# Patient Record
Sex: Female | Born: 1990 | Race: White | Hispanic: No | Marital: Married | Smoking: Never smoker
Health system: Southern US, Community
[De-identification: ages and names within clinical notes are randomized; demographics above are authoritative.]

## PROBLEM LIST (undated history)

## (undated) DIAGNOSIS — E162 Hypoglycemia, unspecified: Secondary | ICD-10-CM

## (undated) DIAGNOSIS — F444 Conversion disorder with motor symptom or deficit: Secondary | ICD-10-CM

## (undated) DIAGNOSIS — T5691XA Toxic effect of unspecified metal, accidental (unintentional), initial encounter: Secondary | ICD-10-CM

## (undated) DIAGNOSIS — F424 Excoriation (skin-picking) disorder: Secondary | ICD-10-CM

## (undated) DIAGNOSIS — F3181 Bipolar II disorder: Secondary | ICD-10-CM

## (undated) DIAGNOSIS — J45909 Unspecified asthma, uncomplicated: Secondary | ICD-10-CM

## (undated) DIAGNOSIS — F449 Dissociative and conversion disorder, unspecified: Secondary | ICD-10-CM

## (undated) DIAGNOSIS — F4310 Post-traumatic stress disorder, unspecified: Secondary | ICD-10-CM

## (undated) DIAGNOSIS — F329 Major depressive disorder, single episode, unspecified: Secondary | ICD-10-CM

## (undated) DIAGNOSIS — T8859XA Other complications of anesthesia, initial encounter: Secondary | ICD-10-CM

## (undated) DIAGNOSIS — M249 Joint derangement, unspecified: Secondary | ICD-10-CM

## (undated) HISTORY — DX: Toxic effect of unspecified metal, accidental (unintentional), initial encounter: T56.91XA

## (undated) HISTORY — DX: Conversion disorder with motor symptom or deficit: F44.4

## (undated) HISTORY — PX: MOUTH SURGERY: SHX715

## (undated) HISTORY — DX: Post-traumatic stress disorder, unspecified: F43.10

## (undated) HISTORY — DX: Excoriation (skin-picking) disorder: F42.4

## (undated) HISTORY — PX: WISDOM TOOTH EXTRACTION: SHX21

## (undated) HISTORY — DX: Joint derangement, unspecified: M24.9

## (undated) HISTORY — PX: MULTIPLE TOOTH EXTRACTIONS: SHX2053

## (undated) HISTORY — DX: Hypoglycemia, unspecified: E16.2

## (undated) HISTORY — DX: Unspecified asthma, uncomplicated: J45.909

## (undated) HISTORY — DX: Other complications of anesthesia, initial encounter: T88.59XA

---
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
## 2015-10-12 DIAGNOSIS — F449 Dissociative and conversion disorder, unspecified: Secondary | ICD-10-CM

## 2015-10-12 HISTORY — DX: Dissociative and conversion disorder, unspecified: F44.9

## 2019-01-03 ENCOUNTER — Ambulatory Visit (INDEPENDENT_AMBULATORY_CARE_PROVIDER_SITE_OTHER): Payer: Managed Care, Other (non HMO) | Admitting: Family Medicine

## 2019-01-03 ENCOUNTER — Other Ambulatory Visit: Payer: Self-pay

## 2019-01-03 ENCOUNTER — Encounter: Payer: Self-pay | Admitting: Family Medicine

## 2019-01-03 VITALS — BP 116/64 | HR 111 | Temp 98.6°F | Ht 68.0 in | Wt 131.4 lb

## 2019-01-03 DIAGNOSIS — J4520 Mild intermittent asthma, uncomplicated: Secondary | ICD-10-CM

## 2019-01-03 DIAGNOSIS — F325 Major depressive disorder, single episode, in full remission: Secondary | ICD-10-CM | POA: Diagnosis not present

## 2019-01-03 DIAGNOSIS — F4310 Post-traumatic stress disorder, unspecified: Secondary | ICD-10-CM

## 2019-01-03 DIAGNOSIS — F319 Bipolar disorder, unspecified: Secondary | ICD-10-CM | POA: Insufficient documentation

## 2019-01-03 DIAGNOSIS — M25552 Pain in left hip: Secondary | ICD-10-CM | POA: Insufficient documentation

## 2019-01-03 HISTORY — DX: Pain in left hip: M25.552

## 2019-01-03 HISTORY — DX: Mild intermittent asthma, uncomplicated: J45.20

## 2019-01-03 NOTE — Assessment & Plan Note (Signed)
Stable.  Place referral to psychiatry. 
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
## 2019-01-03 NOTE — Assessment & Plan Note (Signed)
Stable.  Place referral to psychiatry.

## 2019-01-03 NOTE — Assessment & Plan Note (Signed)
History possibly consistent with labral tear given her mechanical symptoms however she has no clear traumatic history.  She does have some hip flexor and abductor weakness on exam which could be contributing.  Discussed home exercise program focused on strengthening these muscles.  Will place referral to sports medicine for further evaluation and management.

## 2019-01-03 NOTE — Patient Instructions (Signed)
It was very nice to see you today!  I think you are having an issue with your hip.  It is possible that she could have a labral tear.  Please work on exercises.  Please come back to see Dr. Berline Chough soon.  Please establish with a psychiatrist soon.   Please come back soon for your physical with blood work.  Take care, Dr Jimmey Ralph

## 2019-01-03 NOTE — Assessment & Plan Note (Signed)
Stable

## 2019-01-15 ENCOUNTER — Ambulatory Visit: Payer: Managed Care, Other (non HMO) | Admitting: Sports Medicine

## 2019-02-09 ENCOUNTER — Other Ambulatory Visit: Payer: Self-pay

## 2019-02-09 DIAGNOSIS — M25552 Pain in left hip: Secondary | ICD-10-CM

## 2019-02-21 ENCOUNTER — Ambulatory Visit (INDEPENDENT_AMBULATORY_CARE_PROVIDER_SITE_OTHER): Payer: Managed Care, Other (non HMO) | Admitting: Family Medicine

## 2019-02-21 ENCOUNTER — Ambulatory Visit: Payer: Self-pay

## 2019-02-21 ENCOUNTER — Other Ambulatory Visit: Payer: Self-pay | Admitting: Family Medicine

## 2019-02-21 ENCOUNTER — Other Ambulatory Visit: Payer: Self-pay

## 2019-02-21 ENCOUNTER — Ambulatory Visit (INDEPENDENT_AMBULATORY_CARE_PROVIDER_SITE_OTHER)
Admission: RE | Admit: 2019-02-21 | Discharge: 2019-02-21 | Disposition: A | Discharge status: Home / Self Care | Payer: Managed Care, Other (non HMO) | Source: Ambulatory Visit | Attending: Family Medicine | Admitting: Family Medicine

## 2019-02-21 ENCOUNTER — Encounter: Payer: Self-pay | Admitting: Family Medicine

## 2019-02-21 VITALS — BP 100/74 | HR 86 | Ht 68.0 in | Wt 134.0 lb

## 2019-02-21 DIAGNOSIS — M25552 Pain in left hip: Secondary | ICD-10-CM

## 2019-02-21 DIAGNOSIS — M24552 Contracture, left hip: Secondary | ICD-10-CM | POA: Diagnosis not present

## 2019-02-21 DIAGNOSIS — G8929 Other chronic pain: Secondary | ICD-10-CM

## 2019-02-21 DIAGNOSIS — E559 Vitamin D deficiency, unspecified: Secondary | ICD-10-CM | POA: Insufficient documentation

## 2019-02-21 DIAGNOSIS — M999 Biomechanical lesion, unspecified: Secondary | ICD-10-CM

## 2019-02-21 DIAGNOSIS — M357 Hypermobility syndrome: Secondary | ICD-10-CM | POA: Diagnosis not present

## 2019-02-21 HISTORY — DX: Vitamin D deficiency, unspecified: E55.9

## 2019-02-21 MED ORDER — DICLOFENAC SODIUM 2 % TD SOLN: 2.0000 g | Freq: Two times a day (BID) | TRANSDERMAL | 3 refills | Status: DC

## 2019-02-21 MED ORDER — VITAMIN D (ERGOCALCIFEROL) 1.25 MG (50000 UNIT) PO CAPS: 50000.0000 [IU] | ORAL_CAPSULE | ORAL | 0 refills | Status: DC

## 2019-02-21 NOTE — Assessment & Plan Note (Signed)
beighton score of 7 while she does text me actively
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
## 2019-02-21 NOTE — Patient Instructions (Signed)
Good to see you  Ice 20 minutes 2 times daily. Usually after activity and before bed. Exercises 3 times a week.  pennsaid pinkie amount topically 2 times daily as needed.  Once weekly vitamin D for 12 weeks for the bone health.  We need to work more on stability and less on flexibility  Started manipulation today as well  See me again in 3-4 weeks
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
## 2019-03-05 DIAGNOSIS — M999 Biomechanical lesion, unspecified: Secondary | ICD-10-CM | POA: Insufficient documentation

## 2019-03-05 HISTORY — DX: Biomechanical lesion, unspecified: M99.9

## 2019-03-05 NOTE — Assessment & Plan Note (Signed)
Decision today to treat with OMT was based on Physical Exam  After verbal consent patient was treated with HVLA, ME, FPR techniques in cervical, thoracic, rib lumbar and sacral areas  Patient tolerated the procedure well with improvement in symptoms  Patient given exercises, stretches and lifestyle modifications  See medications in patient instructions if given  Patient will follow up in 6 weeks 

## 2019-04-10 ENCOUNTER — Encounter: Payer: Self-pay | Admitting: Nurse Practitioner

## 2019-04-10 ENCOUNTER — Other Ambulatory Visit: Payer: Self-pay

## 2019-04-10 ENCOUNTER — Ambulatory Visit (INDEPENDENT_AMBULATORY_CARE_PROVIDER_SITE_OTHER): Payer: Managed Care, Other (non HMO) | Admitting: Nurse Practitioner

## 2019-04-10 VITALS — BP 116/74 | HR 86 | Ht 68.0 in | Wt 135.3 lb

## 2019-04-10 DIAGNOSIS — N898 Other specified noninflammatory disorders of vagina: Secondary | ICD-10-CM

## 2019-04-10 DIAGNOSIS — Z1151 Encounter for screening for human papillomavirus (HPV): Secondary | ICD-10-CM | POA: Diagnosis not present

## 2019-04-10 DIAGNOSIS — Z124 Encounter for screening for malignant neoplasm of cervix: Secondary | ICD-10-CM | POA: Diagnosis not present

## 2019-04-10 DIAGNOSIS — Z113 Encounter for screening for infections with a predominantly sexual mode of transmission: Secondary | ICD-10-CM | POA: Diagnosis not present

## 2019-04-10 DIAGNOSIS — Z0000 Encounter for general adult medical examination without abnormal findings: Secondary | ICD-10-CM

## 2019-04-10 NOTE — Progress Notes (Signed)
New Patient is in the office for annual. Pt does not want any birth control. Pt reports vaginal discharge, previous abdominal pain. GAD7 score= 5.

## 2019-04-10 NOTE — Progress Notes (Signed)
GYNECOLOGY ANNUAL PREVENTATIVE CARE ENCOUNTER NOTE  Subjective:   Maureen [name] is a 28 y.o. [id number] female here for a routine annual gynecologic exam.  Current complaints: vaginal discharge and abdominal pain.   Has monthly menses lasting 4-5 days.  No cramping during menses.  Had brown discharge - like molasses - about a month ago with itching.  Still has a thicker discharge than usual but is no longer brown.  Is a bit worried that she has an infection.  Denies abnormal vaginal bleeding, problems with intercourse or other gynecologic concerns. Does not use contraception.  Is wanting to become pregnant at some point but is not actively trying to conceive at this time.  Intercourse twice a month now.  Of note, she had one second trimester miscarriage.  Baby's heart beat stopped 2 weeks prior to the miscarriage.  Also has a family history of women who had some difficulty becoming pregnant so she does have concerns that she may have trouble conceiving.  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   Gynecologic History Patient's last menstrual period was 03/30/2019. Contraception: none Last Pap: 4 years ago and client reports results were normal.  Obstetric History OB History  Gravida Para Term Preterm AB Living  1       1    SAB TAB Ectopic Multiple Live Births               # Outcome Date GA Lbr Len/2nd Weight Sex Delivery Anes PTL Lv  1 AB 2015 [id number]    SAB       Past Medical History:  Diagnosis Date  . Asthma   . Bipolar 2 disorder (HCC) 2007  . Conversion disorder 2017  . Depression 2007  . Heavy metal poisoning    s/p chelation    History reviewed. No pertinent surgical history.  Current Outpatient Medications on File Prior to Visit  Medication Sig Dispense Refill  . Vitamin D, Ergocalciferol, (DRISDOL) 1.25 MG (50000 UT) CAPS capsule Take 1 capsule (50,000 Units total) by mouth every 7 (seven) days. 12 capsule 0   No current  facility-administered medications on file prior to visit.     Allergies  Allergen Reactions  . Cyclobenzaprine Other (See Comments)    Psychosis  . Penicillins Hives    Social History   Socioeconomic History  . Marital status: Married    Spouse name: Not on file  . Number of children: Not on file  . Years of education: Not on file  . Highest education level: Not on file  Occupational History  . Not on file  Social Needs  . Financial resource strain: Not on file  . Food insecurity    Worry: Not on file    Inability: Not on file  . Transportation needs    Medical: Not on file    Non-medical: Not on file  Tobacco Use  . Smoking status: Never Smoker  . Smokeless tobacco: Never Used  Substance and Sexual Activity  . Alcohol use: Yes    Comment: Rare  . Drug use: Never  . Sexual activity: Yes    Birth control/protection: None  Lifestyle  . Physical activity    Days per week: Not on file    Minutes per session: Not on file  . Stress: Not on file  Relationships  . Social Musicianconnections    Talks on phone: Not on file    Gets together: Not on  file    Attends religious service: Not on file    Active member of club or organization: Not on file    Attends meetings of clubs or organizations: Not on file    Relationship status: Not on file  . Intimate partner violence    Fear of current or ex partner: Not on file    Emotionally abused: Not on file    Physically abused: Not on file    Forced sexual activity: Not on file  Other Topics Concern  . Not on file  Social History Narrative  . Not on file    History reviewed. No pertinent family history.  The following portions of the patient's history were reviewed and updated as appropriate: allergies, current medications, past family history, past medical history, past social history, past surgical history and problem list.  Review of Systems Pertinent items noted in HPI and remainder of comprehensive ROS otherwise negative.    Objective:  BP 116/74   Pulse 86   Ht 5\' 8"  (1.727 m)   Wt 135 lb 4.8 oz (61.4 kg)   LMP 03/30/2019   BMI 20.57 kg/m  CONSTITUTIONAL: Well-developed, well-nourished female in no acute distress.  HENT:  Normocephalic, atraumatic, External right and left ear normal.  EYES: Conjunctivae and EOM are normal. Pupils are equal, round.  No scleral icterus.  NECK: Normal range of motion, supple, no masses.  Normal thyroid.  SKIN: Skin is warm and dry. No rash noted. Not diaphoretic. No erythema. No pallor. NEUROLOGIC: Alert and oriented to person, place, and time. Normal reflexes, muscle tone coordination. No cranial nerve deficit noted. PSYCHIATRIC: Normal mood and affect. Normal behavior. Normal judgment and thought content. CARDIOVASCULAR: Normal heart rate noted, regular rhythm RESPIRATORY: Clear to auscultation bilaterally. Effort and breath sounds normal, no problems with respiration noted. BREASTS: Symmetric in size. No masses, skin changes, nipple drainage, or lymphadenopathy. ABDOMEN: Soft, no distention noted.  No tenderness, rebound or guarding.  PELVIC: Normal appearing external genitalia; normal appearing vaginal mucosa and cervix. Discharge was creamy white. No blood.  Pap smear obtained.  Normal uterine size, no other palpable masses, no uterine or adnexal tenderness. MUSCULOSKELETAL: Normal range of motion. No tenderness.  No cyanosis, clubbing, or edema.    Assessment and Plan:  1. Encounter for annual physical examination excluding gynecological examination in a patient older than 17 years Client reports some anemia and does want to conceive at some point in the next few years.  Will check CBC for HGB.   Advised multivitamin with folic acid daily as she may be wanting to be pregnant. Reviewed most fertile period in menstrual cycle and timing of intercourse to become pregnant.  - Cytology - PAP( [facility]) - Cervicovaginal ancillary only( [facility]) - HIV Antibody  (routine testing w rflx) - RPR - CBC  2. Vaginal discharge White discharge noted today - could be normal but client reports previous brown blood and itching so will check for BV, Trich, yeast, GC and Chlam.  Client has MyChart so will plan to send message if any infection is found.  Will follow up results of pap smear and manage accordingly. Routine preventative health maintenance measures emphasized. Please refer to After Visit Summary for other counseling recommendations.    Earlie Server, RN, MSN, NP-BC Nurse Practitioner, [place] for [facility]

## 2019-04-11 LAB — CERVICOVAGINAL ANCILLARY ONLY
Bacterial vaginitis: NEGATIVE
Candida vaginitis: NEGATIVE
Chlamydia: NEGATIVE
Neisseria Gonorrhea: NEGATIVE
Trichomonas: NEGATIVE

## 2019-04-11 LAB — CYTOLOGY - PAP
Diagnosis: NEGATIVE
HPV: NOT DETECTED

## 2019-04-12 LAB — HIV ANTIBODY (ROUTINE TESTING W REFLEX): HIV Screen 4th Generation wRfx: NONREACTIVE

## 2019-04-12 LAB — CBC
Hematocrit: 37.4 % (ref 34.0–46.6)
Hemoglobin: 12.7 g/dL (ref 11.1–15.9)
MCH: 32.3 pg (ref 26.6–33.0)
MCHC: 34 g/dL (ref 31.5–35.7)
MCV: 95 fL (ref 79–97)
Platelets: 177 10*3/uL (ref 150–450)
RBC: 3.93 x10E6/uL (ref 3.77–5.28)
RDW: 12 % (ref 11.7–15.4)
WBC: 6.4 10*3/uL (ref 3.4–10.8)

## 2019-04-12 LAB — RPR: RPR Ser Ql: NONREACTIVE

## 2019-04-30 ENCOUNTER — Telehealth: Payer: Self-pay

## 2019-04-30 NOTE — Telephone Encounter (Signed)
Called patient back. She is doing fine and would like to not come out in [place] for her follow up. Told to call back if/when she would like to f/u. Patient voices understanding.

## 2019-07-18 ENCOUNTER — Ambulatory Visit: Payer: Managed Care, Other (non HMO) | Admitting: Family Medicine

## 2019-08-01 ENCOUNTER — Other Ambulatory Visit: Payer: Self-pay

## 2019-08-01 ENCOUNTER — Encounter: Payer: Self-pay | Admitting: Family Medicine

## 2019-08-01 ENCOUNTER — Ambulatory Visit (INDEPENDENT_AMBULATORY_CARE_PROVIDER_SITE_OTHER): Payer: Managed Care, Other (non HMO) | Admitting: Family Medicine

## 2019-08-01 VITALS — BP 106/73 | HR 79 | Temp 98.2°F | Ht 68.0 in | Wt 136.2 lb

## 2019-08-01 DIAGNOSIS — M357 Hypermobility syndrome: Secondary | ICD-10-CM

## 2019-08-01 DIAGNOSIS — M25511 Pain in right shoulder: Secondary | ICD-10-CM

## 2019-08-01 DIAGNOSIS — M8580 Other specified disorders of bone density and structure, unspecified site: Secondary | ICD-10-CM

## 2019-08-01 NOTE — Progress Notes (Signed)
   Chief Complaint:  Maureen [name] is a 28 y.o. female who presents today with a chief complaint of shoulder pain.   Assessment/Plan:  Right shoulder pain No significant abnormalities on exam however patient has a significant degree of hypermobility.  Is possible she could be having small subluxations causing her shoulder pain.  Discussed home exercises and handout was given.  She can use over-the-counter meds as needed for pain.  Will place referral to sports medicine for further evaluation management.  Osteopenia Unclear why patient has demineralization at such young age.  Will attempt to get DEXA scan.    Subjective:  HPI:  Right shoulder pain Started a few months ago.  She was doing some cleaning around the house and thinks she may have aggravated it then.  She said grinding and popping since then.  No medications tried.  Pain has been stable.  She was recently evaluated at sports medicine a few months ago for left hip pain.  She was noted to have hypermobility at time.  She was told that she was osteopenic at that appointment as well.  She is concerned about bone loss.  She has been taking vitamin D supplementation.  ROS: Per HPI  PMH: She reports that she has never smoked. She has never used smokeless tobacco. She reports current alcohol use. She reports that she does not use drugs.      Objective:  Physical Exam: BP 106/73   Pulse 79   Temp 98.2 F (36.8 C)   Ht 5\' 8"  (1.727 m)   Wt 136 lb 4 oz (61.8 kg)   LMP 07/25/2019 (Approximate)   SpO2 98%   BMI 20.72 kg/m   Gen: NAD, resting comfortably CV: Regular rate and rhythm with no murmurs appreciated Pulm: Normal work of breathing, clear to auscultation bilaterally with no crackles, wheezes, or rhonchi MSK: -Right shoulder: No deformities.  Full range of motion throughout.  Nontender to palpation.  No pain with resisted supraspinatus testing.  Internal and external rotation normal without pain.  Neer and Hawkins  test negative.  Neurovascular intact distally.      Algis Greenhouse. Jerline Pain, MD 08/01/2019 10:34 AM

## 2019-08-01 NOTE — Patient Instructions (Signed)
It was very nice to see you today!  Please work on the shoulder exercises.  We will try to get a bone density scan approved through your insurance.  I will place a referral for you to see the sports medicine center.  Take care, Dr Jerline Pain  Please try these tips to maintain a healthy lifestyle:   Eat at least 3 REAL meals and 1-2 snacks per day.  Aim for no more than 5 hours between eating.  If you eat breakfast, please do so within one hour of getting up.    Obtain twice as many fruits/vegetables as protein or carbohydrate foods for both lunch and dinner. (Half of each meal should be fruits/vegetables, one quarter protein, and one quarter starchy carbs)   Cut down on sweet beverages. This includes juice, soda, and sweet tea.    Exercise at least 150 minutes every week.

## 2019-11-13 ENCOUNTER — Telehealth: Payer: Self-pay

## 2019-11-13 NOTE — Telephone Encounter (Signed)
Patient calling to follow up on referral that handles bone density. Please call patient back.

## 2019-11-14 NOTE — Telephone Encounter (Signed)
Spoke with patient notified there has been a order placed in [place] her information to [facility] to schedule.

## 2019-11-21 ENCOUNTER — Other Ambulatory Visit: Payer: Managed Care, Other (non HMO)

## 2019-12-06 ENCOUNTER — Ambulatory Visit
Admission: RE | Admit: 2019-12-06 | Discharge: 2019-12-06 | Disposition: A | Discharge status: Home / Self Care | Payer: 59 | Source: Ambulatory Visit | Attending: Family Medicine | Admitting: Family Medicine

## 2019-12-06 ENCOUNTER — Other Ambulatory Visit: Payer: Self-pay

## 2019-12-06 DIAGNOSIS — M8580 Other specified disorders of bone density and structure, unspecified site: Secondary | ICD-10-CM

## 2019-12-06 NOTE — Progress Notes (Signed)
Please inform patient of the following:  Bone density scan is NORMAL.

## 2020-04-23 ENCOUNTER — Other Ambulatory Visit: Payer: Self-pay

## 2020-04-23 ENCOUNTER — Encounter: Payer: Self-pay | Admitting: Family Medicine

## 2020-04-23 ENCOUNTER — Ambulatory Visit: Payer: Self-pay | Admitting: Family Medicine

## 2020-04-23 VITALS — BP 110/70 | HR 89 | Temp 98.4°F | Ht 68.0 in | Wt 145.8 lb

## 2020-04-23 DIAGNOSIS — Z3490 Encounter for supervision of normal pregnancy, unspecified, unspecified trimester: Secondary | ICD-10-CM

## 2020-04-23 DIAGNOSIS — R21 Rash and other nonspecific skin eruption: Secondary | ICD-10-CM

## 2020-04-23 MED ORDER — PREDNISONE 10 MG PO TABS: ORAL_TABLET | ORAL | 0 refills | Status: DC

## 2020-04-23 NOTE — Progress Notes (Signed)
   Maureen [name] is a 29 y.o. female who presents today for an office visit.  Assessment/Plan:  New/Acute Problems: Rash Appearance consistent with eczema.  Possibly pregnancy-induced.  He does also possible this could represent contact dermatitis.  Though would typically not expect such widespread distribution for this.  Given widespread nature we will start prednisone burst.  20 mg daily for 1 week followed by 10 mg daily for 1 week.  Also recommended that she use Claritin or Zyrtec to help control pruritus.  Also recommended twice daily emollient with gentle lotion such as Aveeno or Eucerin sensitive skin.  Also discussed once or twice weekly bleach bath -instructions were given.  If no improvement in the next week or 2 will consider labs and possible biopsy.  Discussed reasons to return to care.    Subjective:  HPI:  Patient here with rash for the past 6 weeks or so.  Started on right arm and then spread to left leg and then her entire body. Husband works as Technical sales engineer. She is concerned about possible insecticide exposure. She has tried using hydrocortisone cream, camphor, benadryl, cocoa butter. Nothing has helped significantly.  She is about 3 months pregnant.  She has been taking the prenatal vitamins.  Has not noticed any other new obvious exposures.  No new soaps.  No detergents.  Symptoms were improving for a few weeks until significantly worsening a couple of weeks ago.  Rash is now located all over arms, legs, and torso.  She has never had any rash on her face.  No fevers or chills.  No recent illnesses.       Objective:  Physical Exam: BP 110/70   Pulse 89   Temp 98.4 F (36.9 C) (Temporal)   Ht 5\' 8"  (1.727 m)   Wt 145 lb 12.8 oz (66.1 kg)   LMP 07/25/2019 (Approximate)   SpO2 99%   BMI 22.17 kg/m   Gen: No acute distress, resting comfortably Skin: Flat erythematous rash with several excoriations scattered across lower extremities, upper extremities,  and torso.  Overlying scale noted as well.  No pustules.  No vesicles. Psych: Normal affect and thought content      Najee Cowens M. 07/27/2019, MD 04/23/2020 11:44 AM

## 2020-04-23 NOTE — Patient Instructions (Signed)
It was very nice to see you today!  Please start the prednisone. Take 2 tablets daily for 1 week and then 1 tab daily for the next week.  Please start taking Claritin or Zyrtec once daily.  Please make sure that you use a sensitive skin lotion such as Aveeno or Eucerin twice daily.  Please also take a bleach bath once or twice per week. Mix a half a cup of bleach with a tub full of water and soak for 10 minutes.  Let me know if not improving in the next week or so.   Take care, Dr Jimmey Ralph  Please try these tips to maintain a healthy lifestyle:   Eat at least 3 REAL meals and 1-2 snacks per day.  Aim for no more than 5 hours between eating.  If you eat breakfast, please do so within one hour of getting up.    Each meal should contain half fruits/vegetables, one quarter protein, and one quarter carbs (no bigger than a computer mouse)   Cut down on sweet beverages. This includes juice, soda, and sweet tea.     Drink at least 1 glass of water with each meal and aim for at least 8 glasses per day   Exercise at least 150 minutes every week.

## 2020-05-05 ENCOUNTER — Encounter: Payer: Self-pay | Admitting: Family Medicine

## 2020-05-05 ENCOUNTER — Encounter: Payer: Self-pay | Admitting: General Practice

## 2020-05-06 ENCOUNTER — Ambulatory Visit (INDEPENDENT_AMBULATORY_CARE_PROVIDER_SITE_OTHER): Payer: Self-pay | Admitting: *Deleted

## 2020-05-06 ENCOUNTER — Other Ambulatory Visit: Payer: Self-pay

## 2020-05-06 ENCOUNTER — Encounter: Payer: Self-pay | Admitting: General Practice

## 2020-05-06 VITALS — BP 118/77 | HR 94 | Temp 98.2°F | Wt 148.2 lb

## 2020-05-06 DIAGNOSIS — Z3481 Encounter for supervision of other normal pregnancy, first trimester: Secondary | ICD-10-CM

## 2020-05-06 DIAGNOSIS — Z3A14 14 weeks gestation of pregnancy: Secondary | ICD-10-CM

## 2020-05-06 DIAGNOSIS — Z3480 Encounter for supervision of other normal pregnancy, unspecified trimester: Secondary | ICD-10-CM | POA: Insufficient documentation

## 2020-05-06 LAB — CYSTIC FIBROSIS DIAGNOSTIC STUDY: Interpretation-CFDNA:: NEGATIVE

## 2020-05-06 NOTE — Progress Notes (Signed)
   PRENATAL INTAKE SUMMARY  Maureen [name] presents today New OB Nurse Interview.  OB History    Gravida  2   Para      Term      Preterm      AB  1   Living        SAB      TAB      Ectopic      Multiple      Live Births             I have reviewed the patient's medical, obstetrical, social, and family histories, medications, and available lab results.  SUBJECTIVE She has a body rash. Seen at primary care provider for the rash and taking steroids  OBJECTIVE Initial nurse interview for history and labs (New OB).  [facility]  EDD: 10/29/2020 by [facility] GA: [redacted]w[redacted]d G2P0010 FHT: 156  GENERAL APPEARANCE: alert, well appearing, in no apparent distress, oriented to person, place and time  ASSESSMENT Normal pregnancy  PLAN Prenatal care-[facility] OB Pnl/HIV  OB Urine Culture GC/CT-at next visit with [name], CNM 05/21/20 HgbEval/SMA/CF ([facility]) Panorama A1C Glucose  Blood pressure monitor given. Has weight scale at home. Patient to sign up for [employer]. Continue PNV  Clovis Pu, RN

## 2020-05-06 NOTE — Patient Instructions (Addendum)
Second Trimester of Pregnancy  The second trimester is from week 14 through week 27 (month 4 through 6). This is often the time in pregnancy that you feel your best. Often times, morning sickness has lessened or quit. You may have more energy, and you may get hungry more often. Your unborn baby is growing rapidly. At the end of the sixth month, he or she is about 9 inches long and weighs about 1 pounds. You will likely feel the baby move between 18 and 20 weeks of pregnancy. Follow these instructions at home: Medicines  Take over-the-counter and prescription medicines only as told by your doctor. Some medicines are safe and some medicines are not safe during pregnancy.  Take a prenatal vitamin that contains at least 600 micrograms (mcg) of folic acid.  If you have trouble pooping (constipation), take medicine that will make your stool soft (stool softener) if your doctor approves. Eating and drinking   Eat regular, healthy meals.  Avoid raw meat and uncooked cheese.  If you get low calcium from the food you eat, talk to your doctor about taking a daily calcium supplement.  Avoid foods that are high in fat and sugars, such as fried and sweet foods.  If you feel sick to your stomach (nauseous) or throw up (vomit): ? Eat 4 or 5 small meals a day instead of 3 large meals. ? Try eating a few soda crackers. ? Drink liquids between meals instead of during meals.  To prevent constipation: ? Eat foods that are high in fiber, like fresh fruits and vegetables, whole grains, and beans. ? Drink enough fluids to keep your pee (urine) clear or pale yellow. Activity  Exercise only as told by your doctor. Stop exercising if you start to have cramps.  Do not exercise if it is too hot, too humid, or if you are in a place of great height (high altitude).  Avoid heavy lifting.  Wear low-heeled shoes. Sit and stand up straight.  You can continue to have sex unless your doctor tells you not  to. Relieving pain and discomfort  Wear a good support bra if your breasts are tender.  Take warm water baths (sitz baths) to soothe pain or discomfort caused by hemorrhoids. Use hemorrhoid cream if your doctor approves.  Rest with your legs raised if you have leg cramps or low back pain.  If you develop puffy, bulging veins (varicose veins) in your legs: ? Wear support hose or compression stockings as told by your doctor. ? Raise (elevate) your feet for 15 minutes, 3-4 times a day. ? Limit salt in your food. Prenatal care  Write down your questions. Take them to your prenatal visits.  Keep all your prenatal visits as told by your doctor. This is important. Safety  Wear your seat belt when driving.  Make a list of emergency phone numbers, including numbers for family, friends, the hospital, and police and fire departments. General instructions  Ask your doctor about the right foods to eat or for help finding a counselor, if you need these services.  Ask your doctor about local prenatal classes. Begin classes before month 6 of your pregnancy.  Do not use hot tubs, steam rooms, or saunas.  Do not douche or use tampons or scented sanitary pads.  Do not cross your legs for long periods of time.  Visit your dentist if you have not done so. Use a soft toothbrush to brush your teeth. Floss gently.  Avoid all  smoking, herbs, and alcohol. Avoid drugs that are not approved by your doctor.  Do not use any products that contain nicotine or tobacco, such as cigarettes and e-cigarettes. If you need help quitting, ask your doctor.  Avoid cat litter boxes and soil used by cats. These carry germs that can cause birth defects in the baby and can cause a loss of your baby (miscarriage) or stillbirth. Contact a doctor if:  You have mild cramps or pressure in your lower belly.  You have pain when you pee (urinate).  You have bad smelling fluid coming from your vagina.  You continue to  feel sick to your stomach (nauseous), throw up (vomit), or have watery poop (diarrhea).  You have a nagging pain in your belly area.  You feel dizzy. Get help right away if:  You have a fever.  You are leaking fluid from your vagina.  You have spotting or bleeding from your vagina.  You have severe belly cramping or pain.  You lose or gain weight rapidly.  You have trouble catching your breath and have chest pain.  You notice sudden or extreme puffiness (swelling) of your face, hands, ankles, feet, or legs.  You have not felt the baby move in over an hour.  You have severe headaches that do not go away when you take medicine.  You have trouble seeing. Summary  The second trimester is from week 14 through week 27 (months 4 through 6). This is often the time in pregnancy that you feel your best.  To take care of yourself and your unborn baby, you will need to eat healthy meals, take medicines only if your doctor tells you to do so, and do activities that are safe for you and your baby.  Call your doctor if you get sick or if you notice anything unusual about your pregnancy. Also, call your doctor if you need help with the right food to eat, or if you want to know what activities are safe for you. This information is not intended to replace advice given to you by your health care provider. Make sure you discuss any questions you have with your health care provider. Document Revised: 01/19/2019 Document Reviewed: 11/02/2016 Elsevier Patient Education  2020 [employer]  Vaginal Bleeding During Pregnancy, Second Trimester  A small amount of bleeding (spotting) from the vagina is common during pregnancy. Sometimes the bleeding is normal and is not a sign of problems. In some other cases, it is a sign of something serious. Tell your doctor right away if there is any bleeding from your vagina. Follow these instructions at home: Activity  Follow your doctor's instructions about  how active you can be.  If needed, make plans for someone to help with your normal activities.  Do not exercise or do activities that take a lot of effort until your doctor says that this is safe.  Do not lift anything that is heavier than 10 lb (4.5 kg) until your doctor says that this is safe.  Do not have sex or orgasms until your doctor says that this is safe. Medicines  Take over-the-counter and prescription medicines only as told by your doctor.  Do not take aspirin. It can cause bleeding. General instructions  Watch your condition for any changes.  Write down: ? The number of pads you use each day. ? How often you change pads. ? How soaked your pads are.  Do not use tampons.  Do not douche.  If you pass  any tissue from your vagina, save it to show to your doctor.  Keep all follow-up visits as told by your doctor. This is important. Contact a doctor if:  You have bleeding in the vagina at any time during pregnancy.  You have cramps.  You have a fever that does not get better with medicine. Get help right away if:  You have very bad cramps in your back or belly (abdomen).  You have contractions.  You have chills.  You pass large clots or a lot of tissue from your vagina.  Your bleeding gets worse.  You feel light-headed.  You feel weak.  You pass out (faint).  You are leaking fluid from your vagina.  You have a gush of fluid from your vagina. Summary  Sometimes vaginal bleeding during pregnancy is normal and is not a problem. Sometimes it may be a sign of something serious.  Tell your doctor about any bleeding from your vagina right away.  Follow your doctor's instructions about how active you can be. You may need someone to help you with your normal activities. This information is not intended to replace advice given to you by your health care provider. Make sure you discuss any questions you have with your health care provider. Document  Revised: 01/16/2019 Document Reviewed: 12/29/2016 Elsevier Patient Education  2020 [employer].  Warning Signs During Pregnancy A pregnancy lasts about 40 weeks, starting from the first day of your last period until the baby is born. Pregnancy is divided into three phases called trimesters.  The first trimester refers to week 1 through week 13 of pregnancy.  The second trimester is the start of week 14 through the end of week 27.  The third trimester is the start of week 28 until you deliver your baby. During each trimester of pregnancy, certain signs and symptoms may indicate a problem. Talk with your health care provider about your current health and any medical conditions you have. Make sure you know the symptoms that you should watch for and report. How does this affect me?  Warning signs in the first trimester While some changes during the first trimester may be uncomfortable, most do not represent a serious problem. Let your health care provider know if you have any of the following warning signs in the first trimester:  You cannot eat or drink without vomiting, and this lasts for longer than a day.  You have vaginal bleeding or spotting along with menstrual-like cramping.  You have diarrhea for longer than a day.  You have a fever or other signs of infection, such as: ? Pain or burning when you urinate. ? Foul smelling or thick or yellowish vaginal discharge. Warning signs in the second trimester As your baby grows and changes during the second trimester, there are additional signs and symptoms that may indicate a problem. These include:  Signs and symptoms of infection, including a fever.  Signs or symptoms of a miscarriage or preterm labor, such as regular contractions, menstrual-like cramping, or lower abdominal pain.  Bloody or watery vaginal discharge or obvious vaginal bleeding.  Feeling like your heart is pounding.  Having trouble breathing.  Nausea, vomiting,  or diarrhea that lasts for longer than a day.  Craving non-food items, such as clay, chalk, or dirt. This may be a sign of a very treatable medical condition called pica. Later in your second trimester, watch for signs and symptoms of a serious medical condition called preeclampsia.These include:  Changes in your vision.  A severe headache that does not go away.  Nausea and vomiting. It is also important to notice if your baby stops moving or moves less than usual during this time. Warning signs in the third trimester As you approach the third trimester, your baby is growing and your body is preparing for the birth of your baby. In your third trimester, be sure to let your health care provider know if:  You have signs and symptoms of infection, including a fever.  You have vaginal bleeding.  You notice that your baby is moving less than usual or is not moving.  You have nausea, vomiting, or diarrhea that lasts for longer than a day.  You have a severe headache that does not go away.  You have vision changes, including seeing spots or having blurry or double vision.  You have increased swelling in your hands or face. How does this affect my baby? Throughout your pregnancy, always report any of the warning signs of a problem to your health care provider. This can help prevent complications that may affect your baby, including:  Increased risk for premature birth.  Infection that may be transmitted to your baby.  Increased risk for stillbirth. Contact a health care provider if:  You have any of the warning signs of a problem for the current trimester of your pregnancy.  Any of the following apply to you during any trimester of pregnancy: ? You have strong emotions, such as sadness or anxiety, that interfere with work or personal relationships. ? You feel unsafe in your home and need help finding a safe place to live. ? You are using tobacco products, alcohol, or drugs and you  need help to stop. Get help right away if: You have signs or symptoms of labor before 37 weeks of pregnancy. These include:  Contractions that are 5 minutes or less apart, or that increase in frequency, intensity, or length.  Sudden, sharp abdominal pain or low back pain.  Uncontrolled gush or trickle of fluid from your vagina. Summary  A pregnancy lasts about 40 weeks, starting from the first day of your last period until the baby is born. Pregnancy is divided into three phases called trimesters. Each trimester has warning signs to watch for.  Always report any warning signs to your health care provider in order to prevent complications that may affect both you and your baby.  Talk with your health care provider about your current health and any medical conditions you have. Make sure you know the symptoms that you should watch for and report. This information is not intended to replace advice given to you by your health care provider. Make sure you discuss any questions you have with your health care provider. Document Revised: 01/16/2019 Document Reviewed: 07/14/2017 Elsevier Patient Education  2020 [employer].  How to Take Your Blood Pressure You can take your blood pressure at home with a machine. You may need to check your blood pressure at home:  To check if you have high blood pressure (hypertension).  To check your blood pressure over time.  To make sure your blood pressure medicine is working. Supplies needed: You will need a blood pressure machine, or monitor. You can buy one at a drugstore or online. When choosing one:  Choose one with an arm cuff.  Choose one that wraps around your upper arm. Only one finger should fit between your arm and the cuff.  Do not choose one that measures your blood pressure from your  wrist or finger. Your doctor can suggest a monitor. How to prepare Avoid these things for 30 minutes before checking your blood pressure:  Drinking  caffeine.  Drinking alcohol.  Eating.  Smoking.  Exercising. Five minutes before checking your blood pressure:  Pee.  Sit in a dining chair. Avoid sitting in a soft couch or armchair.  Be quiet. Do not talk. How to take your blood pressure Follow the instructions that came with your machine. If you have a digital blood pressure monitor, these may be the instructions: 1. Sit up straight. 2. Place your feet on the floor. Do not cross your ankles or legs. 3. Rest your left arm at the level of your heart. You may rest it on a table, desk, or chair. 4. Pull up your shirt sleeve. 5. Wrap the blood pressure cuff around the upper part of your left arm. The cuff should be 1 inch (2.5 cm) above your elbow. It is best to wrap the cuff around bare skin. 6. Fit the cuff snugly around your arm. You should be able to place only one finger between the cuff and your arm. 7. Put the cord inside the groove of your elbow. 8. Press the power button. 9. Sit quietly while the cuff fills with air and loses air. 10. Write down the numbers on the screen. 11. Wait 2-3 minutes and then repeat steps 1-10. What do the numbers mean? Two numbers make up your blood pressure. The first number is called systolic pressure. The second is called diastolic pressure. An example of a blood pressure reading is "120 over 80" (or 120/80). If you are an adult and do not have a medical condition, use this guide to find out if your blood pressure is normal: Normal  First number: below 120.  Second number: below 80. Elevated  First number: 120-129.  Second number: below 80. Hypertension stage 1  First number: 130-139.  Second number: 80-89. Hypertension stage 2  First number: 140 or above.  Second number: 90 or above. Your blood pressure is above normal even if only the top or bottom number is above normal. Follow these instructions at home:  Check your blood pressure as often as your doctor tells you  to.  Take your monitor to your next doctor's appointment. Your doctor will: ? Make sure you are using it correctly. ? Make sure it is working right.  Make sure you understand what your blood pressure numbers should be.  Tell your doctor if your medicines are causing side effects. Contact a doctor if:  Your blood pressure keeps being high. Get help right away if:  Your first blood pressure number is higher than 180.  Your second blood pressure number is higher than 120. This information is not intended to replace advice given to you by your health care provider. Make sure you discuss any questions you have with your health care provider. Document Revised: 09/09/2017 Document Reviewed: 03/05/2016 Elsevier Patient Education  2020 [employer].  Genetic Testing During Pregnancy Genetic testing during pregnancy is also called prenatal genetic testing. This type of testing can determine if your baby is at risk of being born with a disorder caused by abnormal genes or chromosomes (genetic disorder). Chromosomes contain genes that control how your baby will develop in your womb. There are many different genetic disorders. Examples of genetic disorders that may be found through genetic testing include Down syndrome and cystic fibrosis. Gene changes (mutations) can be passed down through families. Genetic testing  is offered to all women before or during pregnancy. You can choose whether to have genetic testing. Why is genetic testing done? Genetic testing is done during pregnancy to find out whether your child is at risk for a genetic disorder. Having genetic testing allows you to:  Discuss your test results and options with a genetic counselor.  Prepare for a baby that may be born with a genetic disorder. Learning about the disorder ahead of time helps you be better prepared to manage it. Your health care providers can also be prepared in case your baby requires special care before or after  birth.  Consider whether you want to continue with the pregnancy. In some cases, genetic testing may be done to learn about the traits a child will inherit. Types of genetic tests There are two basic types of genetic testing. Screening tests indicate whether your developing baby (fetus) is at higher risk for a genetic disorder. Diagnostic tests check actual fetal cells to diagnose a genetic disorder. Screening tests     Screening tests will not harm your baby. They are recommended for all pregnant women. Types of screening tests include:  Carrier screening. This test involves checking genes from both parents by testing their blood or saliva. The test checks to find out if the parents carry a genetic mutation that may be passed to a baby. In most cases, both parents must carry the mutation for a baby to be at risk.  First trimester screening. This test combines a blood test with sound wave imaging of your baby (fetal ultrasound). This screening test checks for a risk of Down syndrome or other defects caused by having extra chromosomes. It also checks for defects of the heart, abdomen, or skeleton.  Second trimester screening also combines a blood test with a fetal ultrasound exam. It checks for a risk of genetic defects of the face, brain, spine, heart, or limbs.  Combined or sequential screening. This type of testing combines the results of first and second trimester screening. This type of testing may be more accurate than first or second trimester screening alone.  Cell-free DNA testing. This is a blood test that detects cells released by the placenta that get into the mother's blood. It can be used to check for a risk of Down syndrome, other extra chromosome syndromes, and disorders caused by abnormal numbers of sex chromosomes. This test can be done any time after 10 weeks of pregnancy.  Diagnostic tests Diagnostic tests carry slight risks of problems, including bleeding, infection, and  loss of the pregnancy. These tests are done only if your baby is at risk for a genetic disorder. You may meet with a genetic counselor to discuss the risks and benefits before having diagnostic tests. Examples of diagnostic tests include:  Chorionic villus sampling (CVS). This involves a procedure to remove and test a sample of cells taken from the placenta. The procedure may be done between 10 and 12 weeks of pregnancy.  Amniocentesis. This involves a procedure to remove and test a sample of fluid (amniotic fluid) and cells from the sac that surrounds the developing baby. The procedure may be done between 15 and 20 weeks of pregnancy. What do the results mean? For a screening test:  If the results are negative, it often means that your child is not at higher risk. There is still a slight chance your child could have a genetic disorder.  If the results are positive, it does not mean your child will have  a genetic disorder. It may mean that your child has a higher-than-normal risk for a genetic disorder. In that case, you may want to talk with a genetic counselor about whether you should have diagnostic genetic tests. For a diagnostic test:  If the result is negative, it is unlikely that your child will have a genetic disorder.  If the test is positive for a genetic disorder, it is likely that your child will have the disorder. The test may not tell how severe the disorder will be. Talk with your health care provider about your options. Questions to ask your health care provider Before talking to your health care provider about genetic testing, find out if there is a history of genetic disorders in your family. It may also help to know your family's ethnic origins. Then ask your health care provider the following questions:  Is my baby at risk for a genetic disorder?  What are the benefits of having genetic screening?  What tests are best for me and my baby?  What are the risks of each  test?  If I get a positive result on a screening test, what is the next step?  Should I meet with a genetic counselor before having a diagnostic test?  Should my partner or other members of my family be tested?  How much do the tests cost? Will my insurance cover the testing? Summary  Genetic testing is done during pregnancy to find out whether your child is at risk for a genetic disorder.  Genetic testing is offered to all women before or during pregnancy. You can choose whether to have genetic testing.  There are two basic types of genetic testing. Screening tests indicate whether your developing baby (fetus) is at higher risk for a genetic disorder. Diagnostic tests check actual fetal cells to diagnose a genetic disorder.  If a diagnostic genetic test is positive, talk with your health care provider about your options. This information is not intended to replace advice given to you by your health care provider. Make sure you discuss any questions you have with your health care provider. Document Revised: 01/18/2019 Document Reviewed: 12/12/2017 Elsevier Patient Education  2020 [employer]

## 2020-05-07 LAB — CBC/D/PLT+RPR+RH+ABO+RUB AB...
Antibody Screen: NEGATIVE
Basophils Absolute: 0 10*3/uL (ref 0.0–0.2)
Basos: 0 %
EOS (ABSOLUTE): 0 10*3/uL (ref 0.0–0.4)
Eos: 0 %
HCV Ab: <0.1 s/co ratio (ref 0.0–0.9)
HIV Screen 4th Generation wRfx: NONREACTIVE
Hematocrit: 36.5 % (ref 34.0–46.6)
Hemoglobin: 12.8 g/dL (ref 11.1–15.9)
Hepatitis B Surface Ag: NEGATIVE
Immature Grans (Abs): 0 10*3/uL (ref 0.0–0.1)
Immature Granulocytes: 0 %
Lymphocytes Absolute: 1.3 10*3/uL (ref 0.7–3.1)
Lymphs: 14 %
MCH: 33.1 pg — ABNORMAL HIGH (ref 26.6–33.0)
MCHC: 35.1 g/dL (ref 31.5–35.7)
MCV: 94 fL (ref 79–97)
Monocytes Absolute: 0.4 10*3/uL (ref 0.1–0.9)
Monocytes: 5 %
Neutrophils Absolute: 7.5 10*3/uL — ABNORMAL HIGH (ref 1.4–7.0)
Neutrophils: 81 %
Platelets: 169 10*3/uL (ref 150–450)
RBC: 3.87 x10E6/uL (ref 3.77–5.28)
RDW: 12.5 % (ref 11.7–15.4)
RPR Ser Ql: NONREACTIVE
Rh Factor: POSITIVE
Rubella Antibodies, IGG: <0.9 index — ABNORMAL LOW (ref >0.99)
WBC: 9.3 10*3/uL (ref 3.4–10.8)

## 2020-05-07 LAB — HCV INTERPRETATION

## 2020-05-07 LAB — HEMOGLOBIN A1C
Est. average glucose Bld gHb Est-mCnc: 94 mg/dL
Hgb A1c MFr Bld: 4.9 % (ref 4.8–5.6)

## 2020-05-08 LAB — CULTURE, OB URINE

## 2020-05-08 LAB — URINE CULTURE, OB REFLEX

## 2020-05-13 ENCOUNTER — Encounter: Payer: Self-pay | Admitting: General Practice

## 2020-05-14 ENCOUNTER — Other Ambulatory Visit: Payer: Self-pay

## 2020-05-14 MED ORDER — TRIAMCINOLONE ACETONIDE 0.1 % EX CREA: 1.0000 "application " | TOPICAL_CREAM | Freq: Two times a day (BID) | CUTANEOUS | 2 refills | Status: DC

## 2020-05-15 ENCOUNTER — Encounter: Payer: Self-pay | Admitting: General Practice

## 2020-05-21 ENCOUNTER — Encounter: Payer: Self-pay | Admitting: Obstetrics and Gynecology

## 2020-05-21 ENCOUNTER — Encounter: Payer: Self-pay | Admitting: General Practice

## 2020-05-21 ENCOUNTER — Other Ambulatory Visit: Payer: Self-pay

## 2020-05-21 ENCOUNTER — Other Ambulatory Visit (HOSPITAL_COMMUNITY)
Admission: RE | Admit: 2020-05-21 | Discharge: 2020-05-21 | Disposition: A | Discharge status: Home / Self Care | Payer: Self-pay | Source: Ambulatory Visit | Attending: Obstetrics and Gynecology | Admitting: Obstetrics and Gynecology

## 2020-05-21 ENCOUNTER — Ambulatory Visit (INDEPENDENT_AMBULATORY_CARE_PROVIDER_SITE_OTHER): Payer: Self-pay | Admitting: Obstetrics and Gynecology

## 2020-05-21 VITALS — BP 104/65 | HR 103 | Temp 98.2°F | Wt 150.8 lb

## 2020-05-21 DIAGNOSIS — R21 Rash and other nonspecific skin eruption: Secondary | ICD-10-CM

## 2020-05-21 DIAGNOSIS — Z3480 Encounter for supervision of other normal pregnancy, unspecified trimester: Secondary | ICD-10-CM | POA: Insufficient documentation

## 2020-05-21 DIAGNOSIS — O09299 Supervision of pregnancy with other poor reproductive or obstetric history, unspecified trimester: Secondary | ICD-10-CM

## 2020-05-21 DIAGNOSIS — Z3A17 17 weeks gestation of pregnancy: Secondary | ICD-10-CM

## 2020-05-21 LAB — OB RESULTS CONSOLE GC/CHLAMYDIA: Gonorrhea: NEGATIVE

## 2020-05-21 NOTE — Patient Instructions (Signed)
Second Trimester of Pregnancy The second trimester is from week 14 through week 27 (months 4 through 6). The second trimester is often a time when you feel your best. Your body has adjusted to being pregnant, and you begin to feel better physically. Usually, morning sickness has lessened or quit completely, you may have more energy, and you may have an increase in appetite. The second trimester is also a time when the fetus is growing rapidly. At the end of the sixth month, the fetus is about 9 inches long and weighs about 1 pounds. You will likely begin to feel the baby move (quickening) between 16 and 20 weeks of pregnancy. Body changes during your second trimester Your body continues to go through many changes during your second trimester. The changes vary from woman to woman.  Your weight will continue to increase. You will notice your lower abdomen bulging out.  You may begin to get stretch marks on your hips, abdomen, and breasts.  You may develop headaches that can be relieved by medicines. The medicines should be approved by your health care provider.  You may urinate more often because the fetus is pressing on your bladder.  You may develop or continue to have heartburn as a result of your pregnancy.  You may develop constipation because certain hormones are causing the muscles that push waste through your intestines to slow down.  You may develop hemorrhoids or swollen, bulging veins (varicose veins).  You may have back pain. This is caused by: ? Weight gain. ? Pregnancy hormones that are relaxing the joints in your pelvis. ? A shift in weight and the muscles that support your balance.  Your breasts will continue to grow and they will continue to become tender.  Your gums may bleed and may be sensitive to brushing and flossing.  Dark spots or blotches (chloasma, mask of pregnancy) may develop on your face. This will likely fade after the baby is born.  A dark line from your  belly button to the pubic area (linea nigra) may appear. This will likely fade after the baby is born.  You may have changes in your hair. These can include thickening of your hair, rapid growth, and changes in texture. Some women also have hair loss during or after pregnancy, or hair that feels dry or thin. Your hair will most likely return to normal after your baby is born. What to expect at prenatal visits During a routine prenatal visit:  You will be weighed to make sure you and the fetus are growing normally.  Your blood pressure will be taken.  Your abdomen will be measured to track your baby's growth.  The fetal heartbeat will be listened to.  Any test results from the previous visit will be discussed. Your health care provider may ask you:  How you are feeling.  If you are feeling the baby move.  If you have had any abnormal symptoms, such as leaking fluid, bleeding, severe headaches, or abdominal cramping.  If you are using any tobacco products, including cigarettes, chewing tobacco, and electronic cigarettes.  If you have any questions. Other tests that may be performed during your second trimester include:  Blood tests that check for: ? Low iron levels (anemia). ? High blood sugar that affects pregnant women (gestational diabetes) between 24 and 28 weeks. ? Rh antibodies. This is to check for a protein on red blood cells (Rh factor).  Urine tests to check for infections, diabetes, or protein in the   urine.  An ultrasound to confirm the proper growth and development of the baby.  An amniocentesis to check for possible genetic problems.  Fetal screens for spina bifida and Down syndrome.  HIV (human immunodeficiency virus) testing. Routine prenatal testing includes screening for HIV, unless you choose not to have this test. Follow these instructions at home: Medicines  Follow your health care provider's instructions regarding medicine use. Specific medicines may be  either safe or unsafe to take during pregnancy.  Take a prenatal vitamin that contains at least 600 micrograms (mcg) of folic acid.  If you develop constipation, try taking a stool softener if your health care provider approves. Eating and drinking   Eat a balanced diet that includes fresh fruits and vegetables, whole grains, good sources of protein such as meat, eggs, or tofu, and low-fat dairy. Your health care provider will help you determine the amount of weight gain that is right for you.  Avoid raw meat and uncooked cheese. These carry germs that can cause birth defects in the baby.  If you have low calcium intake from food, talk to your health care provider about whether you should take a daily calcium supplement.  Limit foods that are high in fat and processed sugars, such as fried and sweet foods.  To prevent constipation: ? Drink enough fluid to keep your urine clear or pale yellow. ? Eat foods that are high in fiber, such as fresh fruits and vegetables, whole grains, and beans. Activity  Exercise only as directed by your health care provider. Most women can continue their usual exercise routine during pregnancy. Try to exercise for 30 minutes at least 5 days a week. Stop exercising if you experience uterine contractions.  Avoid heavy lifting, wear low heel shoes, and practice good posture.  A sexual relationship may be continued unless your health care provider directs you otherwise. Relieving pain and discomfort  Wear a good support bra to prevent discomfort from breast tenderness.  Take warm sitz baths to soothe any pain or discomfort caused by hemorrhoids. Use hemorrhoid cream if your health care provider approves.  Rest with your legs elevated if you have leg cramps or low back pain.  If you develop varicose veins, wear support hose. Elevate your feet for 15 minutes, 3-4 times a day. Limit salt in your diet. Prenatal Care  Write down your questions. Take them to  your prenatal visits.  Keep all your prenatal visits as told by your health care provider. This is important. Safety  Wear your seat belt at all times when driving.  Make a list of emergency phone numbers, including numbers for family, friends, the hospital, and police and fire departments. General instructions  Ask your health care provider for a referral to a local prenatal education class. Begin classes no later than the beginning of month 6 of your pregnancy.  Ask for help if you have counseling or nutritional needs during pregnancy. Your health care provider can offer advice or refer you to specialists for help with various needs.  Do not use hot tubs, steam rooms, or saunas.  Do not douche or use tampons or scented sanitary pads.  Do not cross your legs for long periods of time.  Avoid cat litter boxes and soil used by cats. These carry germs that can cause birth defects in the baby and possibly loss of the fetus by miscarriage or stillbirth.  Avoid all smoking, herbs, alcohol, and unprescribed drugs. Chemicals in these products can affect the formation   and growth of the baby.  Do not use any products that contain nicotine or tobacco, such as cigarettes and e-cigarettes. If you need help quitting, ask your health care provider.  Visit your dentist if you have not gone yet during your pregnancy. Use a soft toothbrush to brush your teeth and be gentle when you floss. Contact a health care provider if:  You have dizziness.  You have mild pelvic cramps, pelvic pressure, or nagging pain in the abdominal area.  You have persistent nausea, vomiting, or diarrhea.  You have a bad smelling vaginal discharge.  You have pain when you urinate. Get help right away if:  You have a fever.  You are leaking fluid from your vagina.  You have spotting or bleeding from your vagina.  You have severe abdominal cramping or pain.  You have rapid weight gain or weight loss.  You have  shortness of breath with chest pain.  You notice sudden or extreme swelling of your face, hands, ankles, feet, or legs.  You have not felt your baby move in over an hour.  You have severe headaches that do not go away when you take medicine.  You have vision changes. Summary  The second trimester is from week 14 through week 27 (months 4 through 6). It is also a time when the fetus is growing rapidly.  Your body goes through many changes during pregnancy. The changes vary from woman to woman.  Avoid all smoking, herbs, alcohol, and unprescribed drugs. These chemicals affect the formation and growth your baby.  Do not use any tobacco products, such as cigarettes, chewing tobacco, and e-cigarettes. If you need help quitting, ask your health care provider.  Contact your health care provider if you have any questions. Keep all prenatal visits as told by your health care provider. This is important. This information is not intended to replace advice given to you by your health care provider. Make sure you discuss any questions you have with your health care provider. Document Revised: 01/19/2019 Document Reviewed: 11/02/2016 Elsevier Patient Education  2020 [employer] Alpha-Fetoprotein Test Why am I having this test? The alpha-fetoprotein test is most commonly used in pregnant women to help screen for birth defects in their unborn baby. It can be used to screen for birth defects, such as chromosome (DNA) abnormalities, problems with the brain or spinal cord, or problems with the abdominal wall of the unborn baby (fetus). The alpha-fetoprotein test may also be done for men or non-pregnant women to check for certain cancers. What is being tested? This test measures the amount of alpha-fetoprotein (AFP) in your blood. AFP is a protein that is made by the liver. Levels can be detected in the mother's blood during pregnancy, starting at 10 weeks and peaking at 16-18 weeks of the pregnancy.  Abnormal levels can sometimes be a sign of a birth defect in the baby. Certain cancers can cause a high level of AFP in men and non-pregnant women. What kind of sample is taken?  A blood sample is required for this test. It is usually collected by inserting a needle into a blood vessel. How are the results reported? Your test results will be reported as values. Your health care provider will compare your results to normal ranges that were established after testing a large group of people (reference values). Reference values may vary among labs and hospitals. For this test, common reference values are:  Adult: Less than 40 ng/mL or less than 40 mcg/L (  40 mcg/L (SI units).  Child younger than 1 year: Less than 30 ng/mL. If you are pregnant, the values may also vary based on how long you have been pregnant. What do the results mean? Results that are above the reference values in pregnant women may indicate the following for the baby:  Neural tube defects, such as abnormalities of the spinal cord or brain.  Abdominal wall defects.  Multiple pregnancy such as twins.  Fetal distress or fetal death. Results that are above the reference values in men or non-pregnant women may indicate:  Reproductive cancers, such as ovarian or testicular cancer.  Liver cancer.  Liver cell death.  Other types of cancer. Very low levels of AFP in pregnant women may indicate the following for the baby:  Down syndrome.  Fetal death. Talk with your health care provider about what your results mean. Questions to ask your health care provider Ask your health care provider, or the department that is doing the test:  When will my results be ready?  How will I get my results?  What are my treatment options?  What other tests do I need?  What are my next steps? Summary  The alpha-fetoprotein test is done on pregnant women to help screen for birth defects in their unborn baby.  Certain cancers can cause a high  level of AFP in men and non-pregnant women.  For this test, a blood sample is usually collected by inserting a needle into a blood vessel.  Talk with your health care provider about what your results mean. This information is not intended to replace advice given to you by your health care provider. Make sure you discuss any questions you have with your health care provider. Document Revised: 09/09/2017 Document Reviewed: 05/03/2017 Elsevier Patient Education  2020 [employer].
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
## 2020-05-22 ENCOUNTER — Ambulatory Visit (INDEPENDENT_AMBULATORY_CARE_PROVIDER_SITE_OTHER): Payer: Self-pay | Admitting: Licensed Clinical Social Worker

## 2020-05-22 DIAGNOSIS — Z8659 Personal history of other mental and behavioral disorders: Secondary | ICD-10-CM

## 2020-05-22 DIAGNOSIS — F4322 Adjustment disorder with anxiety: Secondary | ICD-10-CM

## 2020-05-22 LAB — CERVICOVAGINAL ANCILLARY ONLY
Bacterial Vaginitis (gardnerella): NEGATIVE
Candida Glabrata: NEGATIVE
Candida Vaginitis: NEGATIVE
Chlamydia: NEGATIVE
Comment: NEGATIVE
Comment: NEGATIVE
Comment: NEGATIVE
Comment: NEGATIVE
Comment: NEGATIVE
Comment: NORMAL
Neisseria Gonorrhea: NEGATIVE
Trichomonas: NEGATIVE

## 2020-05-22 NOTE — BH Specialist Note (Addendum)
Integrated Behavioral Health via Telemedicine Video (Caregility) Visit  05/22/2020 Shams Fill [id number]  Number of Integrated Behavioral Health visits: 1 Session Start time: 10:30am  Session End time: 11:12am Total time: 42 mins via mychart   Referring Provider: Carloyn Jaeger CNM Type of Visit: Video Patient/Family location: Home  [facility] Provider location: [facility]  All persons participating in visit: Pt Maureen [name] and LCSWA A. Felton Clinton   Discussed confidentiality: yes  I connected with Maureen [name] and/or Maureen [name]'s n/a by a video enabled telemedicine application (Caregility) and verified that I am speaking with the correct person using two identifiers.    I discussed that engaging in this virtual visit, they consent to the provision of behavioral healthcare and the services will be billed under their insurance.   Patient and/or legal guardian expressed understanding and consented to virtual visit: yes  PRESENTING CONCERNS: Patient and/or family reports the following symptoms/concerns: anxiety  Duration of problem: since adolescene; Severity of problem: mild   STRENGTHS (Protective Factors/Coping Skills): Supportive spouse   GOALS ADDRESSED: Patient will: 1.  Reduce symptoms of: adjustment disorder  2.  Increase knowledge of diagnosis and implement coping skills   3.  Demonstrate ability to: self manage symptoms   INTERVENTIONS: Interventions utilized:  Supportive counseling  Standardized Assessments completed:    Initial Prenatal from 05/21/2020 in [facility]  PHQ-9 Total Score 12      ASSESSMENT: Patient currently experiencing adjustment disorder with anxious mood   Patient may benefit from psychiatry to address various behavioral health diagnosis. Patient reports she is diagnosis with bipolar disorder, ptsd, conversion disorder, functional neurological disorder and dissociation. [name] behavioral health  needs are best managed with a MD and psychiatry.   PLAN: 1. Follow up with psychiatry 2. Behavioral recommendations: mindfulness techniques and take all prescribed medication  3. Referral(s): outpatient psychiatry and nurse family partnership   I discussed the assessment and treatment plan with the patient and/or parent/guardian. They were provided an opportunity to ask questions and all were answered. They agreed with the plan and demonstrated an understanding of the instructions.   They were advised to call back or seek an in-person evaluation if the symptoms worsen or if the condition fails to improve as anticipated.   Confirmed patient's address: yes Confirmed patient's phone number: yes Any changes to demographics: no  Confirmed patient's insurance: no Any changes to patient's insurance: no   I discussed the limitations of evaluation and management by telemedicine and the availability of in person appointments.  I discussed that the purpose of this visit is to provide behavioral health care while limiting exposure to the novel coronavirus.   Discussed there is a possibility of technology failure and discussed alternative modes of communication if that failure occurs.  Gwyndolyn Saxon

## 2020-06-10 ENCOUNTER — Encounter: Payer: Self-pay | Admitting: General Practice

## 2020-06-26 ENCOUNTER — Telehealth (INDEPENDENT_AMBULATORY_CARE_PROVIDER_SITE_OTHER): Payer: Self-pay | Admitting: Obstetrics and Gynecology

## 2020-06-26 VITALS — BP 109/71 | HR 94 | Wt 147.0 lb

## 2020-06-26 DIAGNOSIS — O26892 Other specified pregnancy related conditions, second trimester: Secondary | ICD-10-CM

## 2020-06-26 DIAGNOSIS — J45909 Unspecified asthma, uncomplicated: Secondary | ICD-10-CM

## 2020-06-26 DIAGNOSIS — R21 Rash and other nonspecific skin eruption: Secondary | ICD-10-CM

## 2020-06-26 DIAGNOSIS — F319 Bipolar disorder, unspecified: Secondary | ICD-10-CM

## 2020-06-26 DIAGNOSIS — Z3480 Encounter for supervision of other normal pregnancy, unspecified trimester: Secondary | ICD-10-CM

## 2020-06-26 DIAGNOSIS — O99342 Other mental disorders complicating pregnancy, second trimester: Secondary | ICD-10-CM

## 2020-06-26 DIAGNOSIS — Z3A22 22 weeks gestation of pregnancy: Secondary | ICD-10-CM

## 2020-06-26 DIAGNOSIS — F4310 Post-traumatic stress disorder, unspecified: Secondary | ICD-10-CM

## 2020-06-26 DIAGNOSIS — O99512 Diseases of the respiratory system complicating pregnancy, second trimester: Secondary | ICD-10-CM

## 2020-06-26 NOTE — Progress Notes (Signed)
   MY CHART VIDEO VIRTUAL OBSTETRICS VISIT ENCOUNTER NOTE  I connected with Maureen [name] on 06/29/20 at  4:10 PM EDT by My Chart video at home and verified that I am speaking with the correct person using two identifiers. Provider located at [employer] for [employer] at [place].   I discussed the limitations, risks, security and privacy concerns of performing an evaluation and management service by My Chart video and the availability of in person appointments. I also discussed with the patient that there may be a patient responsible charge related to this service. The patient expressed understanding and agreed to proceed.  Subjective:  Maureen [name] is a 29 y.o. G2P0010 at [redacted]w[redacted]d being followed for ongoing prenatal care.  She is currently monitored for the following issues for this low-risk pregnancy and has Left hip pain; Bipolar disorder (HCC); PTSD (post-traumatic stress disorder); Major depression in remission (HCC); Mild intermittent asthma without complication; Hip flexor tightness, left; Vitamin D deficiency; Hypermobility syndrome; Nonallopathic lesion of sacral region; Nonallopathic lesion of lumbosacral region; Nonallopathic lesion of thoracic region; and Supervision of other normal pregnancy, antepartum on their problem list.  Patient reports still itching from "pesticide exposure" and wants to have blood tests to detect/confirm possible chemical exposures. Reports fetal movement. Denies any contractions, bleeding or leaking of fluid.   The following portions of the patient's history were reviewed and updated as appropriate: allergies, current medications, past family history, past medical history, past social history, past surgical history and problem list.   Objective:   General:  Alert, oriented and cooperative.   Mental Status: Normal mood and affect perceived. Normal judgment and thought content.  Rest of physical exam deferred due to type of encounter  BP  109/71   Pulse 94   Wt 147 lb (66.7 kg)   LMP 01/23/2020   BMI 22.35 kg/m  **Done by patient's own at home BP cuff and scale  Assessment and Plan:  Pregnancy: G2P0010 at [redacted]w[redacted]d  1. Supervision of other normal pregnancy, antepartum  2. Skin eruption - Pt reports pesticide exposure  - Advised to get MSDS from husband's job, contact the Sales promotion account executive and bring a copy of the MSDS sheets to lab, so [facility] will know exactly which tests needs to be performed for detection  Preterm labor symptoms and general obstetric precautions including but not limited to vaginal bleeding, contractions, leaking of fluid and fetal movement were reviewed in detail with the patient.  I discussed the assessment and treatment plan with the patient. The patient was provided an opportunity to ask questions and all were answered. The patient agreed with the plan and demonstrated an understanding of the instructions. The patient was advised to call back or seek an in-person office evaluation/go to MAU at [facility] for any urgent or concerning symptoms. Please refer to After Visit Summary for other counseling recommendations.   I provided 10 minutes of non-face-to-face time during this encounter. There was 5 minutes of chart review time spent prior to this encounter. Total time spent = 15 minutes.  Return in about 4 weeks (around 07/24/2020) for Return OB - My Chart video.  Future Appointments  Date Time Provider Department Center  07/05/2020 12:00 PM Chales Abrahams, NP [facility] None  07/23/2020  4:10 PM Bernerd Limbo, CNM [facility] None    [name], CNM Center for [employer], [facility]

## 2020-06-29 ENCOUNTER — Encounter: Payer: Self-pay | Admitting: Obstetrics and Gynecology

## 2020-06-29 DIAGNOSIS — R21 Rash and other nonspecific skin eruption: Secondary | ICD-10-CM | POA: Insufficient documentation

## 2020-06-30 NOTE — Telephone Encounter (Signed)
Left voice message for patient to be seen at MAU today.  Clovis Pu, RN

## 2020-07-05 ENCOUNTER — Telehealth (HOSPITAL_COMMUNITY): Payer: No Payment, Other | Admitting: Adult Health

## 2020-07-18 ENCOUNTER — Other Ambulatory Visit: Payer: Self-pay | Admitting: Obstetrics and Gynecology

## 2020-07-18 DIAGNOSIS — R21 Rash and other nonspecific skin eruption: Secondary | ICD-10-CM

## 2020-07-22 ENCOUNTER — Encounter (HOSPITAL_COMMUNITY): Payer: Self-pay | Admitting: Physician Assistant

## 2020-07-22 ENCOUNTER — Telehealth (INDEPENDENT_AMBULATORY_CARE_PROVIDER_SITE_OTHER): Payer: No Payment, Other | Admitting: Physician Assistant

## 2020-07-22 DIAGNOSIS — R21 Rash and other nonspecific skin eruption: Secondary | ICD-10-CM
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
## 2020-07-23 ENCOUNTER — Telehealth (INDEPENDENT_AMBULATORY_CARE_PROVIDER_SITE_OTHER): Payer: 59 | Admitting: Certified Nurse Midwife

## 2020-07-23 VITALS — BP 105/65 | HR 80 | Wt 155.0 lb

## 2020-07-23 DIAGNOSIS — O99342 Other mental disorders complicating pregnancy, second trimester: Secondary | ICD-10-CM

## 2020-07-23 DIAGNOSIS — J4520 Mild intermittent asthma, uncomplicated: Secondary | ICD-10-CM

## 2020-07-23 DIAGNOSIS — O99512 Diseases of the respiratory system complicating pregnancy, second trimester: Secondary | ICD-10-CM

## 2020-07-23 DIAGNOSIS — Z3480 Encounter for supervision of other normal pregnancy, unspecified trimester: Secondary | ICD-10-CM

## 2020-07-23 DIAGNOSIS — F319 Bipolar disorder, unspecified: Secondary | ICD-10-CM

## 2020-07-23 DIAGNOSIS — Z3A26 26 weeks gestation of pregnancy: Secondary | ICD-10-CM

## 2020-07-23 NOTE — Progress Notes (Signed)
OBSTETRICS PRENATAL VIRTUAL VISIT ENCOUNTER NOTE  Provider location: Center for [facility]   I connected with [name] on 07/23/20 at  4:10 PM EDT by MyChart Video Encounter at home and verified that I am speaking with the correct person using two identifiers.   I discussed the limitations, risks, security and privacy concerns of performing an evaluation and management service virtually and the availability of in person appointments. I also discussed with the patient that there may be a patient responsible charge related to this service. The patient expressed understanding and agreed to proceed. Subjective:  Maureen [name] is a 29 y.o. G2P0010 at 104w0d being seen today for ongoing prenatal care.  She is currently monitored for the following issues for this low-risk pregnancy and has Left hip pain; Bipolar disorder (HCC); PTSD (post-traumatic stress disorder); Major depression in remission (HCC); Mild intermittent asthma without complication; Hip flexor tightness, left; Vitamin D deficiency; Hypermobility syndrome; Nonallopathic lesion of sacral region; Nonallopathic lesion of lumbosacral region; Nonallopathic lesion of thoracic region; Supervision of other normal pregnancy, antepartum; and Skin eruption on their problem list.  Patient reports continued issues with her rash and itchy skin, dermatology cannot get her in for 5 months. She also had questions about the Covid vaccine and possible reactions given her history.  Contractions: Not present. Vag. Bleeding: None.  Movement: Present. Denies any leaking of fluid.   The following portions of the patient's history were reviewed and updated as appropriate: allergies, current medications, past family history, past medical history, past social history, past surgical history and problem list.   Objective:   Vitals:   07/23/20 1614  BP: 105/65  Pulse: 80  Weight: 155 lb (70.3 kg)    Fetal Status:      Movement: Present     General:  Alert, oriented and cooperative. Patient is in no acute distress.  Respiratory: Normal respiratory effort, no problems with respiration noted  Mental Status: Normal mood and affect. Normal behavior. Normal judgment and thought content.  Rest of physical exam deferred due to type of encounter  Assessment and Plan:  Pregnancy: G2P0010 at [redacted]w[redacted]d 1. Supervision of other normal pregnancy, antepartum - Suggested she look for a different dermatologist and if they can get her in earlier, we will fax the referral to them, pt amenable to this plan - Suggested a visit with an allergist, pt desires to wait for this as allergists charge around $1200 and she is currently uninsured - Advised to try nightly bathing followed by hydrocortisone cream mixed with eucerin with aquafor layered on top. - Encouraged Covid vaccination but acknowledged that she MAY be at higher risk for a reaction if her (unknown) reaction at 20 months old was anaphylactic. Advised to go to the [facility] for vaccination so she can be monitored by medical staff. Pt may wait until she can obtain insurance.  2. 26 weeks of gestation - Anticipatory guidance given regarding the 2hr GTT at next visit  Preterm labor symptoms and general obstetric precautions including but not limited to vaginal bleeding, contractions, leaking of fluid and fetal movement were reviewed in detail with the patient. I discussed the assessment and treatment plan with the patient. The patient was provided an opportunity to ask questions and all were answered. The patient agreed with the plan and demonstrated an understanding of the instructions. The patient was advised to call back or seek an in-person office evaluation/go to [facility] at [facility] for any urgent or  concerning symptoms. Please refer to After Visit Summary for other counseling recommendations.   I provided 20 minutes of face-to-face time during this  encounter.  No follow-ups on file.  Future Appointments  Date Time Provider Department Center  07/28/2020  8:00 AM Ardith Dark, MD [facility]  08/06/2020  8:10 AM Bernerd Limbo, CNM [facility] None   Edd Arbour, CNM, MSN, [facility] 07/23/20 4:49 PM

## 2020-07-24 ENCOUNTER — Ambulatory Visit: Payer: Self-pay | Admitting: Family Medicine

## 2020-07-28 ENCOUNTER — Ambulatory Visit: Payer: Self-pay | Admitting: Family Medicine

## 2020-07-29 ENCOUNTER — Telehealth: Payer: Self-pay | Admitting: General Practice

## 2020-07-29 NOTE — Telephone Encounter (Signed)
Patient transferred care to [facility] in [place].

## 2020-08-06 ENCOUNTER — Encounter: Payer: Self-pay | Admitting: Certified Nurse Midwife

## 2020-08-07 LAB — SICKLE CELL SCREEN: Sickle Cell Screen: NORMAL

## 2020-08-07 LAB — OB RESULTS CONSOLE HGB/HCT, BLOOD
HCT: 36 (ref 29–41)
Hemoglobin: 12.5

## 2020-08-07 LAB — OB RESULTS CONSOLE VARICELLA ZOSTER ANTIBODY, IGG: Varicella: NON-IMMUNE/NOT IMMUNE

## 2020-08-07 LAB — OB RESULTS CONSOLE RPR: RPR: NONREACTIVE

## 2020-08-07 LAB — GLUCOSE TOLERANCE, 1 HOUR: Glucose, 1 Hour GTT: 92

## 2020-08-08 ENCOUNTER — Other Ambulatory Visit: Payer: Self-pay | Admitting: Family Medicine

## 2020-08-08 DIAGNOSIS — O365920 Maternal care for other known or suspected poor fetal growth, second trimester, not applicable or unspecified: Secondary | ICD-10-CM

## 2020-08-11 LAB — HEMOGLOBIN EVAL RFX ELECTROPHORESIS: Hemoglobin Evaluation: NORMAL

## 2020-08-14 ENCOUNTER — Other Ambulatory Visit: Payer: Self-pay

## 2020-08-14 ENCOUNTER — Encounter: Payer: Self-pay | Admitting: *Deleted

## 2020-08-14 ENCOUNTER — Ambulatory Visit: Payer: Self-pay | Attending: Family Medicine

## 2020-08-14 ENCOUNTER — Ambulatory Visit (HOSPITAL_BASED_OUTPATIENT_CLINIC_OR_DEPARTMENT_OTHER): Payer: Self-pay | Admitting: Genetic Counselor

## 2020-08-14 ENCOUNTER — Ambulatory Visit: Payer: Self-pay | Admitting: Genetic Counselor

## 2020-08-14 ENCOUNTER — Ambulatory Visit: Payer: Self-pay | Admitting: *Deleted

## 2020-08-14 DIAGNOSIS — Z363 Encounter for antenatal screening for malformations: Secondary | ICD-10-CM

## 2020-08-14 DIAGNOSIS — O99891 Other specified diseases and conditions complicating pregnancy: Secondary | ICD-10-CM

## 2020-08-14 DIAGNOSIS — Q790 Congenital diaphragmatic hernia: Secondary | ICD-10-CM

## 2020-08-14 DIAGNOSIS — Z3A29 29 weeks gestation of pregnancy: Secondary | ICD-10-CM

## 2020-08-14 DIAGNOSIS — Z3480 Encounter for supervision of other normal pregnancy, unspecified trimester: Secondary | ICD-10-CM | POA: Insufficient documentation

## 2020-08-14 DIAGNOSIS — Z315 Encounter for genetic counseling: Secondary | ICD-10-CM

## 2020-08-14 DIAGNOSIS — O321XX0 Maternal care for breech presentation, not applicable or unspecified: Secondary | ICD-10-CM

## 2020-08-14 DIAGNOSIS — O365920 Maternal care for other known or suspected poor fetal growth, second trimester, not applicable or unspecified: Secondary | ICD-10-CM | POA: Insufficient documentation

## 2020-08-14 DIAGNOSIS — O283 Abnormal ultrasonic finding on antenatal screening of mother: Secondary | ICD-10-CM

## 2020-08-14 NOTE — Progress Notes (Addendum)
ADDENDUM 11/17: Maureen [name] and I have been in frequent email correspondence since Maureen last visit. She informed me that she would like to undergo an amniocentesis to confirm results from Maureen NIPS and assess for additional chromosomal abnormalities. She will return to MFM for an amniocentesis the morning of Thursday 11/18. She understands that a negative result from chromosome testing does not necessarily mean that the baby does not have a genetic condition, as karyotype and chromosomal microarray analysis cannot assess for all possible genetic conditions.  Maureen [name] also informed me that she has signed herself up for a plan through Maureen [name]'s insurance that will be effective as of 12/01. I encouraged Maureen to let me know if she needs assistance with any other resources, as I am happy to have our Social Work team consult with Maureen if she would find it beneficial.  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 Maureen [name] presented to Maureen appointment alone.   Indication for genetic counseling - Congenital diaphragmatic hernia   Prenatal history  Maureen [name] is a G78P0010, 29 y.o. female. Maureen current pregnancy has completed [redacted]w[redacted]d(Estimated Date of Delivery: 10/29/20). Maureen [name] had a previous miscarriage at 183 weeks gestation with a prior partner.  Maureen [name] exposure to environmental toxins or chemical agents. She denied the use of alcohol, tobacco or street drugs. She reported taking prenatal vitamins, prednisone, and triamcinolone. She denied significant viral illnesses, fevers, and bleeding during the course of Maureen pregnancy. She reported a skin infection  at one point in pregnancy. 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 She also reported that Maureen home water supply is contaminated by PFOS and PFAS.  The Agency for Toxic Substances and Disease Registry released an updated 993 page document in May 2021 about the toxicological profile for PFOS/PFAS and found that there have been no consistent reports of an increased risk of birth defects associated with PFOS/PFAS. Pregnant people with exposure to PFAS may be at higher risk of developing preeclampsia or having babies with low birth weight. The [employer] [place]icates that exposure to pesticides may increase the chances of having a baby with a birth defect; however, the link between pesticides and birth defects is not entirely clear. Also, individuals who work with pesticides directly are usually at higher risk than those who are indirectly exposed through other household members.  Ms. SCleerereported a personal history of asthma, functional neurological disorder, bipolar disorder type II, PTSD, and hypermobility. Maureen medical and surgical histories were otherwise noncontributory.  Family History  A three generation pedigree was drafted and reviewed. The family history is remarkable for the following:  - Maureen [name] has a maternal aunt who has a son who was born with a hole in his heart requiring surgical repair. The exact etiology of this congenital heart defect (CHD) is unknown and records are not available. There are multifactorial causes for isolated CHDs, including environmental and genetic factors. If Maureen [name]'s cousin's CHD is isolated, the chance that Maureen [name] could have a child with a CHD is likely not greatly elevated over the 0.5-1% general population risk. If, however, there is a genetic cause for his CHD, recurrence risks could be higher.   - Maureen [name] and Maureen [name]  reportedly  have family histories of miscarriage and infertility. Maureen [name] was told that she originally had a twin but that the other fetus had miscarried. She also believes Maureen mother may have had a history of infertility since she does not have any other siblings, but could not say this with certainty. Ms. Kettlewell has a maternal aunt who reportedly had to have some sort of surgery before she was able to have children. Ms. Denunzio paternal grandmother also reportedly had a few miscarriages, though she was not sure exactly how many. Ms. Smelcer also believed that Maureen [name]'s mother had trouble getting pregnant or had a history of miscarriages as Maureen [name] is also an only child. We reviewed that there are many potential causes of miscarriages and infertility, including some genetic factors. Without knowing the precise etiology of the miscarriages or infertility in the family, risk assessment is limited.  The remaining family histories were reviewed and found to be noncontributory for birth defects, intellectual disability, recurrent pregnancy loss, and known genetic conditions. Ms. Mecum had limited information about Maureen [name]'s paternal family history; thus, risk assessment was limited.  The patient's ancestry is [place]. The father of the pregnancy's ancestry is [place]. Consanguinity was denied. Ms. Goucher was uncertain if she or Maureen [name] have any Ashkenazi Jewish ancestry. Pedigree will be scanned under Media.  Discussion  Congenital diaphragmatic hernia:  I met with Ms. Gehrig at Dr. Fritz Pickerel request to discuss findings from Maureen anatomy ultrasound. A complete ultrasound was performed today prior to our visit. The ultrasound report will be sent under separate cover. A left-sided congenital diaphragmatic hernia was noted. The fetal stomach is located in the fetal chest next to the heart. The fetal heart is shifted to the right side of the chest by the intraabdominal contents.  We  reviewed information about congenital diaphragmatic hernia [facility]). The diaphragm is a thin curved muscle layer that separates the abdominal cavity from the chest cavity. This muscle aids in respiration by expanding and contracting, allowing an individual to inhale and exhale. The diaphragm closes at approximately 6-10 weeks' gestation. Incomplete closure of the diaphragm leads to an opening between the abdominal and chest cavities, which may result in herniation of abdominal organs (stomach, intestines, and/or liver) into the chest cavity. When this occurs, it is referred to as a [facility].   [facility] occurs in approximately 1 in 2500-3000 births. [facility] can occur on the left or right side of the diaphragm or rarely on both sides; however, [facility] is left-sided in the majority of cases (~80%). Minor cases of [facility] may only cause digestive symptoms after birth. Severe cases may cause parts of the intestines, liver, or stomach that have herniated into the chest cavity to press against the lungs and/or heart and interfere with their function. [facility] survivors also have a higher incidence of respiratory, neurologic, gastrointestinal, and nutritional difficulties. The main factors determining prognosis for a fetus with [facility] include the degree of pulmonary hypoplasia present, the presence of other co-existing abnormalities such as a heart defect, the presence of liver in the fetal chest, and polyhydramnios. A lung-to-head ratio (LHR) measurement on prenatal ultrasound of <1.0 is associated with a poor prognosis, and a LHR >1.4 is associated with a favorable prognosis. Overall mortality rate for infants with [facility] is estimated to be between 20-60% Autumn Messing et al., 2020).    We discussed that it is recommended that infants with [facility] deliver at a tertiary care facility with access to neonatology, pediatric surgery, and  extracorporeal membrane oxygenation (ECMO). Surgical repair usually occurs postnatally at a time point designated by the baby's  status. During surgery, the organs in the chest are guided back down into the abdomen and the hole in the diaphragm is sewn closed. The space created in the chest cavity then allows the infant's lungs to continue to grow. Approximately 10% of affected individuals reherniate following their initial surgical repair and may require additional surgery Autumn Messing et al., 2020). Infants with [facility] typically require some form of respiratory support such as mechanical ventilation in the immediate neonatal period. Some infants with [facility] may also require ECMO following delivery. ECMO utilizes a machine to deliver oxygen to the infant's lungs and heart while allowing the infant's lungs to rest and grow. Individuals who require ECMO have a higher incidence of intellectual disability and hearing loss.   [facility] may be an isolated finding. However, approximately 30-50% of cases of [facility] have associated abnormalities, including central nervous system or heart anomalies, and less commonly renal or spinal anomalies (Longoni et al., 2020). Additionally, 15-20% of cases of [facility] have a genetic cause Autumn Messing et al., 2020). We discussed that [facility] can be associated with certain chromosome aneuploidies, including Down syndrome and trisomy 48. In addition, we discussed that [facility] can be seen in association with other chromosome aberrations, such as deletions, duplications, inversions, translocations, and isochromosome 12p (Pallister-Killian syndrome). [facility] is also a features of some single gene conditions such as Donnai-Barrow syndrome, Fryns syndrome, and Cornelia de Lange syndrome. Other non-genetic risk factors associated with [facility] include maternal alcohol use, hypertension, and pregestational diabetes Flint Melter et al., 2014 & Elba Barman et al., 2020).   We discussed that prognosis and recurrence risk for future pregnancies depends upon the underlying etiology of the fetus's [facility]. In the case of isolated [facility], occurrence is typically sporadic. For these  cases, recurrence risk for siblings is approximately 2% (Pober et al., 2005). Rare familial cases of isolated [facility] have been reported and demonstrate a variety of inheritance patterns, including autosomal recessive, autosomal dominant, and X-linked patterns. Recurrence risk is higher in these cases.  Aneuploidy screening results & additional screening/testing options:  We discussed additional screening/testing options to get more information about the fetus's ultrasound anomaly. Given the complexity of the finding identified today, a fetal MRI is recommended to characterize the fetal [facility]. It is also recommend that Ms. Osberg undergo a fetal echocardiogram to evaluate the fetus's heart. We will place a referral for a fetal MRI and fetal echocardiogram at [facility].  We discussed that it is recommended that she deliver at [facility] due to their access to ECMO.  We also discussed prenatal screening and testing options that are available to determine if the fetus's ultrasound findings are due to a genetic cause. We reviewed that Ms. Rennaker had [facility] NIPS through the laboratory [facility] that was low-risk for fetal aneuploidies. We reviewed that these results showed a less than 1 in 10,000 risk for trisomies 21, 18 and 13, and monosomy X (Turner syndrome).  In addition, the risk for triploidy and sex chromosome trisomies (47,XXX and 47,XXY) was also low. Ms. Carnero elected to have cfDNA analysis for 22q11.2 deletion syndrome, which was also low risk (1 in 2900). We reviewed that while this testing identifies 94-99% of pregnancies with trisomy 18, trisomy 70, trisomy 56, sex chromosome aneuploidies, and triploidy, it is NOT diagnostic. A positive test result requires confirmation by CVS or amniocentesis, and a negative test result does not rule out a fetal chromosome abnormality. She  also understands that this testing does not identify all genetic conditions that can be associated with [facility].  Ms. Goodwill was also  counseled regarding diagnostic testing via amniocentesis. We discussed the technical aspects of the procedure and quoted up to a 1 in 500 (0.2%) risk for spontaneous pregnancy loss or other adverse pregnancy outcomes as a result of amniocentesis. Cultured cells from an amniocentesis sample allow for the visualization of a fetal karyotype, which can detect >99% of large chromosomal aberrations. Chromosomal microarray can also be performed to identify smaller deletions or duplications of fetal chromosomal material. We also discussed that a panel of genes associated with [facility] could be ordered on an amniocentesis sample. Ms. Onstad understands that a postnatal evaluation by a medical geneticist and genetic testing would be available if amniocentesis is declined. [facility] is often able to order genetic testing on cord blood.  We reviewed that if Ms. Dray decided to undergo amniocentesis and results were to come back positive for a genetic condition, it could impact pregnancy management in several different ways. We discussed that some individuals may choose to end a pregnancy or consider adoption if a diagnosis of a genetic condition were confirmed in the fetus. Third trimester termination is an available option in several states in the [place] but would require travel. Ms. Scheid informed me that she will be continuing the pregnancy regardless of whether or not the fetus has a genetic condition. For individuals who would not alter their pregnancy management regardless of testing outcomes, a prenatal diagnosis could allow for delivery planning and prenatal consults with specialists that would be involved in the infant's care, as well as time to plan and prepare emotionally, physically, and financially. A prenatal diagnosis would also provide more insight into expected prognosis and other features that the baby may have postnatally. We discussed that since ~80% of [facility] has no known genetic cause, it is possible that we may  not find a genetic explanation for the fetus's [facility], even if extensive genetic testing were pursued.  After careful consideration, Ms. Pardee declined amniocentesis today, preferring instead to pursue postnatal testing. She understands that amniocentesis remains an available option throughout the remainder of Maureen pregnancy should she change Maureen mind at any time. She would like to pursue a fetal MRI and fetal echocardiogram.  Plan:  Ms. Boling remained very composed during today's appointment. She is planning on informing Maureen [name] about the ultrasound finding once he is no longer on call for work. She reported feeling well-supported by him and suspected that she would become emotional once she has time to go home and process all of the information that she received today. I provided Ms. Westra with several resources at Maureen request, including a [facility] Parent Reference Guide from [facility] and photo references on the anatomy of [facility].   Ms. Carmicheal is also currently a self-pay patient. She informed me that she may be able to obtain insurance coverage through Maureen [name]'s employer. We discussed that the costs associated with a fetal echo and fetal MRI could potentially be thousands of dollars. Ms. Sedberry understood this and elected to proceed with scheduling the fetal echo and fetal MRI procedures. She will contact Maureen [name]'s employer to see if pregnancy counts as a qualifying event for Maureen to be added to his insurance.  I counseled Ms. Etchison regarding the above risks and available options. The approximate face-to-face time with the genetic counselor was 60 minutes.  In summary:  Reviewed results of ultrasound  Left-sided  congenital diaphragmatic hernia identified  Recommend fetal MRI and fetal echocardiogram to further characterize fetus's [facility]. We will facilitate referrals  Discussed [facility], including information about possible causes and prognosis  Chromosomal abnormalities,  other genetic syndromes, or multifactorial causes all possible  Prognosis depends on underlying cause, degree of pulmonary hypoplasia, presence of other abnormalities, and organs herniated in fetal chest  Reviewed low-risk NIPS result  Reduction in risk for Down syndrome, trisomy 17, trisomy 69, triploidy, sex chromosome aneuploidies, and 22q11.2 deletion syndrome  Offered additional testing and screening  Declined amniocentesis  Would like to pursue postnatal genetic testing, ideally through cord blood  Reviewed family history concerns   Buelah Manis, MS, Counselling psychologist

## 2020-08-15 ENCOUNTER — Other Ambulatory Visit: Payer: Self-pay | Admitting: *Deleted

## 2020-08-15 DIAGNOSIS — O283 Abnormal ultrasonic finding on antenatal screening of mother: Secondary | ICD-10-CM

## 2020-08-18 ENCOUNTER — Encounter: Payer: Self-pay | Admitting: *Deleted

## 2020-08-19 ENCOUNTER — Encounter: Payer: Self-pay | Admitting: General Practice

## 2020-08-19 DIAGNOSIS — O358XX0 Maternal care for other (suspected) fetal abnormality and damage, not applicable or unspecified: Secondary | ICD-10-CM | POA: Insufficient documentation

## 2020-08-19 DIAGNOSIS — O35FXX0 Maternal care for other (suspected) fetal abnormality and damage, fetal musculoskeletal anomalies of trunk, not applicable or unspecified: Secondary | ICD-10-CM | POA: Insufficient documentation

## 2020-08-19 DIAGNOSIS — Z3480 Encounter for supervision of other normal pregnancy, unspecified trimester: Secondary | ICD-10-CM

## 2020-08-20 ENCOUNTER — Ambulatory Visit: Payer: Self-pay

## 2020-08-21 ENCOUNTER — Other Ambulatory Visit: Payer: Self-pay

## 2020-08-21 ENCOUNTER — Encounter: Payer: Self-pay | Admitting: *Deleted

## 2020-08-21 ENCOUNTER — Encounter: Payer: Self-pay | Admitting: Obstetrics and Gynecology

## 2020-08-21 ENCOUNTER — Ambulatory Visit (INDEPENDENT_AMBULATORY_CARE_PROVIDER_SITE_OTHER): Payer: Self-pay | Admitting: Obstetrics and Gynecology

## 2020-08-21 VITALS — BP 115/81 | HR 98 | Wt 167.6 lb

## 2020-08-21 DIAGNOSIS — Z3480 Encounter for supervision of other normal pregnancy, unspecified trimester: Secondary | ICD-10-CM

## 2020-08-21 DIAGNOSIS — O359XX0 Maternal care for (suspected) fetal abnormality and damage, unspecified, not applicable or unspecified: Secondary | ICD-10-CM | POA: Insufficient documentation

## 2020-08-21 LAB — POCT URINALYSIS DIP (DEVICE)
Bilirubin Urine: NEGATIVE
Glucose, UA: NEGATIVE mg/dL
Hgb urine dipstick: NEGATIVE
Ketones, ur: NEGATIVE mg/dL
Leukocytes,Ua: NEGATIVE
Nitrite: NEGATIVE
Protein, ur: NEGATIVE mg/dL
Specific Gravity, Urine: >=1.03 (ref 1.005–1.030)
Urobilinogen, UA: 0.2 mg/dL (ref 0.0–1.0)
pH: 6 (ref 5.0–8.0)

## 2020-08-21 NOTE — Progress Notes (Signed)
° °  PRENATAL VISIT NOTE  Subjective:  Maureen [name] is a 29 y.o. G3P0020 at [redacted]w[redacted]d being seen today for ongoing prenatal care.  She is currently monitored for the following issues for this high-risk pregnancy and has Left hip pain; Bipolar disorder (HCC); PTSD (post-traumatic stress disorder); Major depression in remission (HCC); Mild intermittent asthma without complication; Hip flexor tightness, left; Vitamin D deficiency; Hypermobility syndrome; Nonallopathic lesion of sacral region; Nonallopathic lesion of lumbosacral region; Nonallopathic lesion of thoracic region; Supervision of other normal pregnancy, antepartum; Skin eruption; and Known fetal anomaly, antepartum on their problem list.  Patient reports no complaints.   .  .   . Denies leaking of fluid.   The following portions of the patient's history were reviewed and updated as appropriate: allergies, current medications, past family history, past medical history, past social history, past surgical history and problem list.   Objective:  There were no vitals filed for this visit.  Fetal Status:           General:  Alert, oriented and cooperative. Patient is in no acute distress.  Skin: Skin is warm and dry. No rash noted.   Cardiovascular: Normal heart rate noted  Respiratory: Normal respiratory effort, no problems with respiration noted  Abdomen: Soft, gravid, appropriate for gestational age.        Pelvic: Cervical exam deferred        Extremities: Normal range of motion.     Mental Status: Normal mood and affect. Normal behavior. Normal judgment and thought content.   Assessment and Plan:  Pregnancy: G3P0020 at [redacted]w[redacted]d 1. Supervision of other normal pregnancy, antepartum Patient is doing well without complaints   2. Known fetal anomaly, antepartum, single or unspecified fetus Reviewed ultrasound findings Patient with normal fetal echo on 11/9 Patient scheduled to meet with [facility] pediatric surgeon on 11/16  Preterm  labor symptoms and general obstetric precautions including but not limited to vaginal bleeding, contractions, leaking of fluid and fetal movement were reviewed in detail with the patient. Please refer to After Visit Summary for other counseling recommendations.   Return in about 2 weeks (around 09/04/2020) for in person, ROB, High risk.  Future Appointments  Date Time Provider Department Center  08/28/2020  7:45 AM [facility] NURSE [facility]  08/28/2020  8:00 AM [facility] US1 [facility]  09/03/2020  2:00 PM [facility] NURSE [facility]  09/03/2020  2:15 PM [facility] US2 [facility]  09/11/2020 12:30 PM [facility] NURSE [facility]  09/11/2020 12:45 PM [facility] US4 [facility]    Leviticus Harton, MD

## 2020-08-21 NOTE — Progress Notes (Signed)
Addendum: pregnancy Navigator Lisa consulted with me and states patient needs assistance in dealing with babies needs due to diaphragmatic hernia and hx of depression. Does not have insurance or qualify for medicaid or [place] assist. She has suggested to patient and patient accepted referral to [facility]. Consulted with Asher Muir and she will see patient and refer to [facility] who can see uninsured.  Registrar to schedule. Sigrid Schwebach,RN

## 2020-08-21 NOTE — Addendum Note (Signed)
Addended by: Gerome Apley on: 08/21/2020 01:18 PM   Modules accepted: Orders

## 2020-08-22 ENCOUNTER — Encounter: Payer: Self-pay | Admitting: Obstetrics

## 2020-08-28 ENCOUNTER — Ambulatory Visit: Payer: 59 | Admitting: *Deleted

## 2020-08-28 ENCOUNTER — Other Ambulatory Visit: Payer: Self-pay | Admitting: *Deleted

## 2020-08-28 ENCOUNTER — Other Ambulatory Visit: Payer: Self-pay | Admitting: Obstetrics

## 2020-08-28 ENCOUNTER — Other Ambulatory Visit: Payer: Self-pay

## 2020-08-28 ENCOUNTER — Ambulatory Visit: Payer: 59 | Attending: Obstetrics and Gynecology

## 2020-08-28 ENCOUNTER — Encounter: Payer: Self-pay | Admitting: *Deleted

## 2020-08-28 DIAGNOSIS — Z362 Encounter for other antenatal screening follow-up: Secondary | ICD-10-CM

## 2020-08-28 DIAGNOSIS — Q790 Congenital diaphragmatic hernia: Secondary | ICD-10-CM

## 2020-08-28 DIAGNOSIS — Z3480 Encounter for supervision of other normal pregnancy, unspecified trimester: Secondary | ICD-10-CM

## 2020-08-28 DIAGNOSIS — O283 Abnormal ultrasonic finding on antenatal screening of mother: Secondary | ICD-10-CM

## 2020-08-28 DIAGNOSIS — O358XX0 Maternal care for other (suspected) fetal abnormality and damage, not applicable or unspecified: Secondary | ICD-10-CM | POA: Diagnosis not present

## 2020-08-28 DIAGNOSIS — O99891 Other specified diseases and conditions complicating pregnancy: Secondary | ICD-10-CM

## 2020-08-28 DIAGNOSIS — Z3A31 31 weeks gestation of pregnancy: Secondary | ICD-10-CM | POA: Insufficient documentation

## 2020-08-28 DIAGNOSIS — O359XX0 Maternal care for (suspected) fetal abnormality and damage, unspecified, not applicable or unspecified: Secondary | ICD-10-CM

## 2020-08-28 NOTE — Procedures (Signed)
Maureen [name] May 07, 1991 [redacted]w[redacted]d  Fetus A Non-Stress Test Interpretation for 08/28/20  Indication: Post amnio for Diapragmatic hernia  Fetal Heart Rate A Mode: External Baseline Rate (A): 130 bpm Variability: Moderate Accelerations: 10 x 10 Decelerations: None Multiple birth?: No  Uterine Activity Mode: Palpation, Toco Contraction Frequency (min): x1 Contraction Duration (sec): 40 Contraction Quality: Mild (Pt denies feeling) Resting Tone Palpated: Relaxed Resting Time: Adequate  Interpretation (Fetal Testing) Nonstress Test Interpretation: Reactive Overall Impression: Reassuring for gestational age Comments: Reviewed tracing with Dr. Judeth Cornfield

## 2020-08-29 ENCOUNTER — Other Ambulatory Visit: Payer: Self-pay

## 2020-08-29 NOTE — Progress Notes (Unsigned)
Post amnio call today, Identified pt by Name/DOB.  Pt feeling good fetal movement.  No vaginal bleeding or U/C.  Pt did report clear vaginal discharge of less than a teaspoon x 1.  No further leakage or discharge.  All questions answered. Pt will be updated when results return.

## 2020-09-03 ENCOUNTER — Encounter: Payer: Self-pay | Admitting: Family Medicine

## 2020-09-03 ENCOUNTER — Encounter: Payer: Self-pay | Admitting: *Deleted

## 2020-09-03 ENCOUNTER — Ambulatory Visit: Payer: Self-pay | Admitting: *Deleted

## 2020-09-03 ENCOUNTER — Ambulatory Visit (INDEPENDENT_AMBULATORY_CARE_PROVIDER_SITE_OTHER): Payer: Self-pay | Admitting: Family Medicine

## 2020-09-03 ENCOUNTER — Other Ambulatory Visit: Payer: Self-pay

## 2020-09-03 ENCOUNTER — Ambulatory Visit: Payer: Self-pay | Attending: Obstetrics and Gynecology

## 2020-09-03 ENCOUNTER — Encounter: Payer: Self-pay | Admitting: Lactation Services

## 2020-09-03 VITALS — BP 111/58 | HR 97 | Wt 165.0 lb

## 2020-09-03 DIAGNOSIS — M357 Hypermobility syndrome: Secondary | ICD-10-CM

## 2020-09-03 DIAGNOSIS — O99891 Other specified diseases and conditions complicating pregnancy: Secondary | ICD-10-CM

## 2020-09-03 DIAGNOSIS — O283 Abnormal ultrasonic finding on antenatal screening of mother: Secondary | ICD-10-CM | POA: Insufficient documentation

## 2020-09-03 DIAGNOSIS — J4520 Mild intermittent asthma, uncomplicated: Secondary | ICD-10-CM

## 2020-09-03 DIAGNOSIS — O358XX0 Maternal care for other (suspected) fetal abnormality and damage, not applicable or unspecified: Secondary | ICD-10-CM

## 2020-09-03 DIAGNOSIS — O35FXX0 Maternal care for other (suspected) fetal abnormality and damage, fetal musculoskeletal anomalies of trunk, not applicable or unspecified: Secondary | ICD-10-CM

## 2020-09-03 DIAGNOSIS — Q790 Congenital diaphragmatic hernia: Secondary | ICD-10-CM

## 2020-09-03 DIAGNOSIS — F325 Major depressive disorder, single episode, in full remission: Secondary | ICD-10-CM

## 2020-09-03 DIAGNOSIS — Z3480 Encounter for supervision of other normal pregnancy, unspecified trimester: Secondary | ICD-10-CM

## 2020-09-03 DIAGNOSIS — Z3A32 32 weeks gestation of pregnancy: Secondary | ICD-10-CM

## 2020-09-03 DIAGNOSIS — Z362 Encounter for other antenatal screening follow-up: Secondary | ICD-10-CM

## 2020-09-03 DIAGNOSIS — O403XX0 Polyhydramnios, third trimester, not applicable or unspecified: Secondary | ICD-10-CM

## 2020-09-03 NOTE — Progress Notes (Signed)
Spoke with patient while she was in the office for her OB appointment. Patient is to deliver at [facility] due to Diaphragmatic Hernia.   Patient asking what to expect when infant is born with breast feeding. Reviewed with mom that infant will most likely be assessed by NICU team at delivery and taken to NICU soon after delivery. Reviewed that due to patients condition he most likely will not be able to eat for a period of time. Reviewed her starting pumping and hand expression as soon after delivery as she is able to stimulate milk production. Reviewed normal progression of milk coming to volume. Reviewed that the nurses and lactation consultants can be helpful with setting up pump and hand expression.   Reviewed that baby may not be able to eat for a while depending on respiratory status and surgical status and that mom can participate in his care in other ways and hopefully hold him once he is stable.   Patient is client with [facility]. Will send message to [facility] to inquire about a pump for patient after she is discharged. Will ask Candice to reach out to patient.

## 2020-09-03 NOTE — Patient Instructions (Signed)
Contraception Choices Contraception, also called birth control, refers to methods or devices that prevent pregnancy. Hormonal methods Contraceptive implant  A contraceptive implant is a thin, plastic tube that contains a hormone. It is inserted into the upper part of the arm. It can remain in place for up to 3 years. Progestin-only injections Progestin-only injections are injections of progestin, a synthetic form of the hormone progesterone. They are given every 3 months by a health care provider. Birth control pills  Birth control pills are pills that contain hormones that prevent pregnancy. They must be taken once a day, preferably at the same time each day. Birth control patch  The birth control patch contains hormones that prevent pregnancy. It is placed on the skin and must be changed once a week for three weeks and removed on the fourth week. A prescription is needed to use this method of contraception. Vaginal ring  A vaginal ring contains hormones that prevent pregnancy. It is placed in the vagina for three weeks and removed on the fourth week. After that, the process is repeated with a new ring. A prescription is needed to use this method of contraception. Emergency contraceptive Emergency contraceptives prevent pregnancy after unprotected sex. They come in pill form and can be taken up to 5 days after sex. They work best the sooner they are taken after having sex. Most emergency contraceptives are available without a prescription. This method should not be used as your only form of birth control. Barrier methods Female condom  A female condom is a thin sheath that is worn over the penis during sex. Condoms keep sperm from going inside a woman's body. They can be used with a spermicide to increase their effectiveness. They should be disposed after a single use. Female condom  A female condom is a soft, loose-fitting sheath that is put into the vagina before sex. The condom keeps sperm  from going inside a woman's body. They should be disposed after a single use. Diaphragm  A diaphragm is a soft, dome-shaped barrier. It is inserted into the vagina before sex, along with a spermicide. The diaphragm blocks sperm from entering the uterus, and the spermicide kills sperm. A diaphragm should be left in the vagina for 6-8 hours after sex and removed within 24 hours. A diaphragm is prescribed and fitted by a health care provider. A diaphragm should be replaced every 1-2 years, after giving birth, after gaining more than 15 lb (6.8 kg), and after pelvic surgery. Cervical cap  A cervical cap is a round, soft latex or plastic cup that fits over the cervix. It is inserted into the vagina before sex, along with spermicide. It blocks sperm from entering the uterus. The cap should be left in place for 6-8 hours after sex and removed within 48 hours. A cervical cap must be prescribed and fitted by a health care provider. It should be replaced every 2 years. Sponge  A sponge is a soft, circular piece of polyurethane foam with spermicide on it. The sponge helps block sperm from entering the uterus, and the spermicide kills sperm. To use it, you make it wet and then insert it into the vagina. It should be inserted before sex, left in for at least 6 hours after sex, and removed and thrown away within 30 hours. Spermicides Spermicides are chemicals that kill or block sperm from entering the cervix and uterus. They can come as a cream, jelly, suppository, foam, or tablet. A spermicide should be inserted into the   vagina with an applicator at least 10-15 minutes before sex to allow time for it to work. The process must be repeated every time you have sex. Spermicides do not require a prescription. Intrauterine contraception Intrauterine device (IUD) An IUD is a T-shaped device that is put in a woman's uterus. There are two types:  Hormone IUD.This type contains progestin, a synthetic form of the hormone  progesterone. This type can stay in place for 3-5 years.  Copper IUD.This type is wrapped in copper wire. It can stay in place for 10 years.  Permanent methods of contraception Female tubal ligation In this method, a woman's fallopian tubes are sealed, tied, or blocked during surgery to prevent eggs from traveling to the uterus. Hysteroscopic sterilization In this method, a small, flexible insert is placed into each fallopian tube. The inserts cause scar tissue to form in the fallopian tubes and block them, so sperm cannot reach an egg. The procedure takes about 3 months to be effective. Another form of birth control must be used during those 3 months. Female sterilization This is a procedure to tie off the tubes that carry sperm (vasectomy). After the procedure, the man can still ejaculate fluid (semen). Natural planning methods Natural family planning In this method, a couple does not have sex on days when the woman could become pregnant. Calendar method This means keeping track of the length of each menstrual cycle, identifying the days when pregnancy can happen, and not having sex on those days. Ovulation method In this method, a couple avoids sex during ovulation. Symptothermal method This method involves not having sex during ovulation. The woman typically checks for ovulation by watching changes in her temperature and in the consistency of cervical mucus. Post-ovulation method In this method, a couple waits to have sex until after ovulation. Summary  Contraception, also called birth control, means methods or devices that prevent pregnancy.  Hormonal methods of contraception include implants, injections, pills, patches, vaginal rings, and emergency contraceptives.  Barrier methods of contraception can include female condoms, female condoms, diaphragms, cervical caps, sponges, and spermicides.  There are two types of IUDs (intrauterine devices). An IUD can be put in a woman's uterus to  prevent pregnancy for 3-5 years.  Permanent sterilization can be done through a procedure for males, females, or both.  Natural family planning methods involve not having sex on days when the woman could become pregnant. This information is not intended to replace advice given to you by your health care provider. Make sure you discuss any questions you have with your health care provider. Document Revised: 09/29/2017 Document Reviewed: 10/30/2016 Elsevier Patient Education  2020 Elsevier Inc.  

## 2020-09-03 NOTE — Progress Notes (Signed)
   Subjective:  Maureen [name] is a 29 y.o. G3P0020 at 60w0dbeing seen today for ongoing prenatal care.  She is currently monitored for the following issues for this high-risk pregnancy and has Left hip pain; Bipolar disorder (HPaden; PTSD (post-traumatic stress disorder); Major depression in remission (HH[place]; Mild intermittent asthma without complication; Hip flexor tightness, left; Vitamin D deficiency; Hypermobility syndrome; Nonallopathic lesion of sacral region; Nonallopathic lesion of lumbosacral region; Nonallopathic lesion of thoracic region; Supervision of other normal pregnancy, antepartum; Skin eruption; Known fetal anomaly, antepartum; and Diaphragmatic hernia, fetal, affecting care of mother, antepartum on their problem list.  Patient reports no complaints.  Contractions: Not present. Vag. Bleeding: None.  Movement: Present. Denies leaking of fluid.   The following portions of the patient's history were reviewed and updated as appropriate: allergies, current medications, past family history, past medical history, past social history, past surgical history and problem list. Problem list updated.  Objective:   Vitals:   09/03/20 1547  BP: (!) 111/58  Pulse: 97  Weight: 165 lb (74.8 kg)    Fetal Status: Fetal Heart Rate (bpm): 120   Movement: Present     General:  Alert, oriented and cooperative. Patient is in no acute distress.  Skin: Skin is warm and dry. No rash noted.   Cardiovascular: Normal heart rate noted  Respiratory: Normal respiratory effort, no problems with respiration noted  Abdomen: Soft, gravid, appropriate for gestational age. Pain/Pressure: Present     Pelvic: Vag. Bleeding: None     Cervical exam deferred        Extremities: Normal range of motion.  Edema: None  Mental Status: Normal mood and affect. Normal behavior. Normal judgment and thought content.   Urinalysis:      Assessment and Plan:  Pregnancy: G3P0020 at 371w0d1. Supervision of other  normal pregnancy, antepartum FHR and BP normal Discussed TDaP and other vaccinations, reports childhood hx of severe allergic reactions though she does not know the details Has not had any vaccines since childhood, wondering if safe to do so I recommended she see an allergist post partum to clarify the history and possibly conduct provacative testing Recommended against COVID vaccine in this scenario given unclear hx of reactions  2. Diaphragmatic hernia of fetus affecting antepartum care of mother, single or unspecified fetus [facility] and with new polyhydramnios Had amniocentesis on 08/28/2020, results not yet in our system Last growth normal on 08/14/2020 F/u scans already scheduled  3. Major depression in remission (HCPlymouth  4. Hypermobility syndrome   5. Mild intermittent asthma without complication   Preterm labor symptoms and general obstetric precautions including but not limited to vaginal bleeding, contractions, leaking of fluid and fetal movement were reviewed in detail with the patient. Please refer to After Visit Summary for other counseling recommendations.  Return in 2 weeks (on 09/17/2020) [facility].   EcClarnce FlockMD

## 2020-09-08 ENCOUNTER — Telehealth: Payer: Self-pay | Admitting: Genetic Counselor

## 2020-09-08 ENCOUNTER — Other Ambulatory Visit: Payer: Self-pay | Admitting: *Deleted

## 2020-09-08 DIAGNOSIS — Z362 Encounter for other antenatal screening follow-up: Secondary | ICD-10-CM
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
## 2020-09-11 ENCOUNTER — Ambulatory Visit: Payer: 59 | Attending: Obstetrics and Gynecology

## 2020-09-11 ENCOUNTER — Other Ambulatory Visit: Payer: Self-pay

## 2020-09-11 ENCOUNTER — Encounter: Payer: Self-pay | Admitting: *Deleted

## 2020-09-11 ENCOUNTER — Ambulatory Visit: Payer: 59 | Admitting: *Deleted

## 2020-09-11 ENCOUNTER — Other Ambulatory Visit: Payer: Self-pay | Admitting: *Deleted

## 2020-09-11 ENCOUNTER — Ambulatory Visit: Payer: Self-pay | Admitting: Genetic Counselor

## 2020-09-11 DIAGNOSIS — Z3480 Encounter for supervision of other normal pregnancy, unspecified trimester: Secondary | ICD-10-CM | POA: Diagnosis present

## 2020-09-11 DIAGNOSIS — O283 Abnormal ultrasonic finding on antenatal screening of mother: Secondary | ICD-10-CM | POA: Insufficient documentation

## 2020-09-11 DIAGNOSIS — Z3A33 33 weeks gestation of pregnancy: Secondary | ICD-10-CM

## 2020-09-11 DIAGNOSIS — O403XX0 Polyhydramnios, third trimester, not applicable or unspecified: Secondary | ICD-10-CM

## 2020-09-11 DIAGNOSIS — Z362 Encounter for other antenatal screening follow-up: Secondary | ICD-10-CM | POA: Insufficient documentation

## 2020-09-11 DIAGNOSIS — O99891 Other specified diseases and conditions complicating pregnancy: Secondary | ICD-10-CM

## 2020-09-11 DIAGNOSIS — Q790 Congenital diaphragmatic hernia: Secondary | ICD-10-CM

## 2020-09-12 NOTE — Progress Notes (Signed)
I spoke with Maureen [name] before her ultrasound today to discuss her negative karyotype results from amniocentesis. Results from fetal karyotype revealed a normal female complement (46,XY). This significantly reduces the possibility of a chromosomal aneuploidy such as Down syndrome, trisomy 42, or trisomy 20 in this fetus, as amniocentesis is able to diagnose chromosome aneuploidies with a sensitivity of >99%.   We discussed that since FISH, karyotype, and chromosomal microarray analysis were all normal, it is extremely unlikely that the fetus has a chromosomal abnormality that explains the congenital diaphragmatic hernia identified on ultrasound. However, these negative results cannot rule out the possibility of all genetic conditions, such as single gene disorders.  Maureen [name] updated me that she has been able to obtain [facility] insurance coverage for the rest of her pregnancy. She has also been granted financial assistance at [facility] for her baby's care after delivery. She is going to contact [facility] billing as she was recently charged for labor and delivery here despite the fact that she will not be delivering at [facility]. She has also reached out to the social worker she spoke with prior to entering into the [employer] program to determine why she has been receiving bills for her routine prenatal care appointments (not her visits with [place] in MFM). Unfortunately, our social work team did not have other recommendations for financial assistance at this time other than [employer], which Maureen [name] has already applied for.  Maureen [name] confirmed that she had no further questions about her amniocentesis results at this time. I wished her the best of luck during the remainder of her pregnancy and encouraged her to contact me should she have any additional questions or concerns.   Gershon Crane, MS, [facility] Genetic Counselor

## 2020-09-18 ENCOUNTER — Ambulatory Visit: Payer: 59

## 2020-09-18 ENCOUNTER — Other Ambulatory Visit: Payer: Self-pay

## 2020-09-22 ENCOUNTER — Other Ambulatory Visit: Payer: Self-pay

## 2020-09-22 ENCOUNTER — Ambulatory Visit (INDEPENDENT_AMBULATORY_CARE_PROVIDER_SITE_OTHER): Payer: 59 | Admitting: Obstetrics and Gynecology

## 2020-09-22 VITALS — BP 104/72 | HR 114 | Wt 169.4 lb

## 2020-09-22 DIAGNOSIS — O358XX0 Maternal care for other (suspected) fetal abnormality and damage, not applicable or unspecified: Secondary | ICD-10-CM

## 2020-09-22 DIAGNOSIS — F325 Major depressive disorder, single episode, in full remission: Secondary | ICD-10-CM

## 2020-09-22 DIAGNOSIS — Z3A34 34 weeks gestation of pregnancy: Secondary | ICD-10-CM

## 2020-09-22 DIAGNOSIS — Z3480 Encounter for supervision of other normal pregnancy, unspecified trimester: Secondary | ICD-10-CM

## 2020-09-22 DIAGNOSIS — O35FXX0 Maternal care for other (suspected) fetal abnormality and damage, fetal musculoskeletal anomalies of trunk, not applicable or unspecified: Secondary | ICD-10-CM

## 2020-09-22 NOTE — Progress Notes (Signed)
   PRENATAL VISIT NOTE  Subjective:  Maureen [name] is a 29 y.o. G3P0020 at [redacted]w[redacted]d being seen today for ongoing prenatal care.  She is currently monitored for the following issues for this high-risk pregnancy and has Left hip pain; Bipolar disorder (HCC); PTSD (post-traumatic stress disorder); Major depression in remission (HCC); Mild intermittent asthma without complication; Hip flexor tightness, left; Vitamin D deficiency; Hypermobility syndrome; Nonallopathic lesion of sacral region; Nonallopathic lesion of lumbosacral region; Nonallopathic lesion of thoracic region; Supervision of other normal pregnancy, antepartum; Skin eruption; Known fetal anomaly, antepartum; and Diaphragmatic hernia, fetal, affecting care of mother, antepartum on their problem list.  Patient reports no complaints.  Contractions: Not present. Vag. Bleeding: None.  Movement: Present. Denies leaking of fluid.   Having financial issues with [facility]. Working with Misty Stanley and others to sort out incorrect billing issues. Feels supported.   The following portions of the patient's history were reviewed and updated as appropriate: allergies, current medications, past family history, past medical history, past social history, past surgical history and problem list.   Objective:   Vitals:   09/22/20 0847  BP: 104/72  Pulse: (!) 114  Weight: 169 lb 6.4 oz (76.8 kg)    Fetal Status: Fetal Heart Rate (bpm): 169   Movement: Present     General:  Alert, oriented and cooperative. Patient is in no acute distress.  Skin: Skin is warm and dry. No rash noted.   Cardiovascular: Normal heart rate noted  Respiratory: Normal respiratory effort, no problems with respiration noted  Abdomen: Soft, gravid, appropriate for gestational age.  Pain/Pressure: Absent     Pelvic: Cervical exam deferred        Extremities: Normal range of motion.  Edema: None  Mental Status: Normal mood and affect. Normal behavior. Normal judgment and thought  content.   Assessment and Plan:  Pregnancy: G3P0020 at [redacted]w[redacted]d 1. Supervision of other normal pregnancy, antepartum -transferring care from here on out at [facility]. Has [place] here still.   2. Diaphragmatic hernia of fetus affecting antepartum care of mother, single or unspecified fetus -transferring care to [facility] from here on out. Has [place] scheduled here still.  -f/u scans already scheduled   3. [redacted] weeks gestation of pregnancy  4. Major depression in remission [facility]) -had been seeing BH here but due to billing issues with [facility], prefers to look elsewhere. Feels supported overall.   Preterm labor symptoms and general obstetric precautions including but not limited to vaginal bleeding, contractions, leaking of fluid and fetal movement were reviewed in detail with the patient. Please refer to After Visit Summary for other counseling recommendations.   No follow-ups on file.  Future Appointments  Date Time Provider Department Center  09/25/2020  1:15 PM [facility] NURSE [facility]  09/25/2020  1:30 PM [facility] US3 [facility]  10/02/2020  9:15 AM [facility] NURSE [facility]  10/02/2020  9:30 AM [facility] US3 [facility]  10/09/2020 12:45 PM [facility] NURSE [facility]  10/09/2020  1:00 PM [facility] US1 [facility]    Gita Kudo, MD

## 2020-09-22 NOTE — Progress Notes (Signed)
Pt declined [facility] due to billing concerns.  Pt reports that she is going to reach out to someone for help.  Resources placed in AVS.    Addison Naegeli, RN

## 2020-09-25 ENCOUNTER — Other Ambulatory Visit: Payer: Self-pay

## 2020-09-25 ENCOUNTER — Ambulatory Visit: Payer: 59 | Admitting: *Deleted

## 2020-09-25 ENCOUNTER — Ambulatory Visit: Payer: 59 | Attending: Obstetrics and Gynecology

## 2020-09-25 DIAGNOSIS — Z3480 Encounter for supervision of other normal pregnancy, unspecified trimester: Secondary | ICD-10-CM | POA: Insufficient documentation

## 2020-09-25 DIAGNOSIS — Z362 Encounter for other antenatal screening follow-up: Secondary | ICD-10-CM | POA: Diagnosis not present

## 2020-09-25 DIAGNOSIS — Z3A35 35 weeks gestation of pregnancy: Secondary | ICD-10-CM | POA: Diagnosis not present

## 2020-09-25 DIAGNOSIS — Q790 Congenital diaphragmatic hernia: Secondary | ICD-10-CM | POA: Diagnosis present

## 2020-10-02 ENCOUNTER — Ambulatory Visit: Payer: 59 | Attending: Obstetrics and Gynecology

## 2020-10-02 ENCOUNTER — Other Ambulatory Visit: Payer: Self-pay

## 2020-10-02 ENCOUNTER — Ambulatory Visit: Payer: 59 | Admitting: *Deleted

## 2020-10-02 ENCOUNTER — Encounter: Payer: Self-pay | Admitting: *Deleted

## 2020-10-02 DIAGNOSIS — Z3480 Encounter for supervision of other normal pregnancy, unspecified trimester: Secondary | ICD-10-CM | POA: Insufficient documentation

## 2020-10-02 DIAGNOSIS — Z3A36 36 weeks gestation of pregnancy: Secondary | ICD-10-CM | POA: Diagnosis not present

## 2020-10-02 DIAGNOSIS — Q790 Congenital diaphragmatic hernia: Secondary | ICD-10-CM | POA: Diagnosis present

## 2020-10-02 DIAGNOSIS — O99891 Other specified diseases and conditions complicating pregnancy: Secondary | ICD-10-CM | POA: Diagnosis not present

## 2020-10-09 ENCOUNTER — Encounter: Payer: Self-pay | Admitting: *Deleted

## 2020-10-09 ENCOUNTER — Ambulatory Visit: Payer: 59 | Attending: Obstetrics and Gynecology

## 2020-10-09 ENCOUNTER — Other Ambulatory Visit: Payer: Self-pay

## 2020-10-09 ENCOUNTER — Other Ambulatory Visit: Payer: Self-pay | Admitting: *Deleted

## 2020-10-09 ENCOUNTER — Ambulatory Visit: Payer: 59 | Admitting: *Deleted

## 2020-10-09 DIAGNOSIS — O99891 Other specified diseases and conditions complicating pregnancy: Secondary | ICD-10-CM | POA: Diagnosis not present

## 2020-10-09 DIAGNOSIS — Q790 Congenital diaphragmatic hernia: Secondary | ICD-10-CM | POA: Insufficient documentation

## 2020-10-09 DIAGNOSIS — Z3480 Encounter for supervision of other normal pregnancy, unspecified trimester: Secondary | ICD-10-CM | POA: Insufficient documentation

## 2020-10-09 DIAGNOSIS — Z3A37 37 weeks gestation of pregnancy: Secondary | ICD-10-CM

## 2020-10-15 DIAGNOSIS — Z2839 Other underimmunization status: Secondary | ICD-10-CM | POA: Insufficient documentation

## 2020-10-16 ENCOUNTER — Ambulatory Visit: Payer: 59

## 2020-10-16 ENCOUNTER — Ambulatory Visit: Payer: 59 | Attending: Obstetrics and Gynecology

## 2020-10-29 ENCOUNTER — Inpatient Hospital Stay (HOSPITAL_COMMUNITY): Admit: 2020-10-29 | Payer: 59

## 2020-12-08 ENCOUNTER — Encounter: Payer: Self-pay | Admitting: Family Medicine

## 2021-01-09 NOTE — Progress Notes (Signed)
[name] [address] [place] [address] Phone: [phone] Subjective:   Bruce Donath, am serving as a scribe for Dr. Antoine Primas.  This visit occurred during the SARS-CoV-2 public health emergency.  Safety protocols were in place, including screening questions prior to the visit, additional usage of staff PPE, and extensive cleaning of exam room while observing appropriate contact time as indicated for disinfecting solutions.  I'm seeing this patient by the request  of:  Ardith Dark, MD  CC: left hip pain   CBU:[id number]  Austine Valene Villa is a 30 y.o. female coming in with complaint of left hip pain. Last seen for OMT in 2020. Patient states that she has pain in neck and shoulders daily. Painful when she performs cervical flexion.   Also having bilateral hip pain that is deep in the joint. Has pinching sensation that causes collapsing but does feel improvement with OMT. Does try to do exercises 2x a week.       Past Medical History:  Diagnosis Date  . Asthma   . Bipolar 2 disorder (HCC) 2007  . Bipolar 2 disorder (HCC)   . Conversion disorder 2017  . Conversion disorder   . Depression 2007  . Heavy metal poisoning    s/p chelation  . Hypermobile joints   . Hypoglycemia   . PTSD (post-traumatic stress disorder)   . Skin-picking disorder    Past Surgical History:  Procedure Laterality Date  . MOUTH SURGERY N/A    Social History   Socioeconomic History  . Marital status: Married    Spouse name: [name]  . Number of children: Not on file  . Years of education: Not on file  . Highest education level: Some college, no degree  Occupational History  . Occupation: self employed    Associate Professor: Production assistant, radio FOR SELF EMPLOYED  Tobacco Use  . Smoking status: Never Smoker  . Smokeless tobacco: Never Used  Vaping Use  . Vaping Use: Never used  Substance and Sexual Activity  . Alcohol use: Not Currently    Comment: Rare   . Drug use: Never  . Sexual activity: Yes    Birth control/protection: None  Other Topics Concern  . Not on file  Social History Narrative  . Not on file   Social Determinants of Health   Financial Resource Strain: Not on file  Food Insecurity: No Food Insecurity  . Worried About Programme researcher, broadcasting/film/video in the Last Year: Never true  . Ran Out of Food in the Last Year: Never true  Transportation Needs: No Transportation Needs  . Lack of Transportation (Medical): No  . Lack of Transportation (Non-Medical): No  Physical Activity: Not on file  Stress: Not on file  Social Connections: Not on file   Allergies  Allergen Reactions  . Cyclobenzaprine Other (See Comments)    Psychosis  . Penicillins Hives   Family History  Problem Relation Age of Onset  . Depression Mother   . Liver disease Father   . Blindness Father   . Diabetes Father   . Hypertension Father   . Cancer Maternal Grandfather   . Miscarriages / Stillbirths Paternal Grandmother          Current Outpatient Medications (Other):  Marland Kitchen  Prenatal Multivit-Min-Fe-FA (PRE-NATAL PO), Take by mouth. .  triamcinolone (KENALOG) 0.1 %, Apply 1 application topically once a week.   Reviewed prior external information including notes and imaging from  primary care provider As  well as notes that were available from care everywhere and other healthcare systems.  Past medical history, social, surgical and family history all reviewed in electronic medical record.  No pertanent information unless stated regarding to the chief complaint.   Review of Systems:  No headache, visual changes, nausea, vomiting, diarrhea, constipation, dizziness, abdominal pain, skin rash, fevers, chills, night sweats, weight loss, swollen lymph nodes, body aches, joint swelling, chest pain, shortness of breath, mood changes. POSITIVE muscle aches  Objective  Blood pressure 108/76, pulse 84, height 5\' 8"  (1.727 m), weight 168 lb (76.2 kg), last  menstrual period 01/23/2020, SpO2 97 %.   General: No apparent distress alert and oriented x3 mood and affect normal, dressed appropriately.  HEENT: Pupils equal, extraocular movements intact  Respiratory: Patient's speak in full sentences and does not appear short of breath  Cardiovascular: No lower extremity edema, non tender, no erythema  Gait normal with good balance and coordination.  MSK: Patient does have tightness noted in the parascapular region right greater than left.  Patient does have a bony prominence noted on the left side of the neck.  Negative Spurling's noted.  Patient does have unfortunately some mild weakness of the core strength noted.  Hip abductor strength 4 out of 5 bilaterally.  Hypermobility noted to multiple other points.  Osteopathic findings C4 flexed rotated and side bent left T3 flexed rotated and side bent right with inhaled rib L4 flexed rotated and side bent left Sacrum right on right   Impression and Recommendations:     The above documentation has been reviewed and is accurate and complete 01/25/2020, DO

## 2021-01-12 ENCOUNTER — Other Ambulatory Visit: Payer: Self-pay

## 2021-01-12 ENCOUNTER — Encounter: Payer: Self-pay | Admitting: Family Medicine

## 2021-01-12 ENCOUNTER — Ambulatory Visit (INDEPENDENT_AMBULATORY_CARE_PROVIDER_SITE_OTHER): Payer: 59 | Admitting: Family Medicine

## 2021-01-12 ENCOUNTER — Ambulatory Visit (INDEPENDENT_AMBULATORY_CARE_PROVIDER_SITE_OTHER): Payer: 59

## 2021-01-12 VITALS — BP 108/76 | HR 84 | Ht 68.0 in | Wt 168.0 lb

## 2021-01-12 DIAGNOSIS — M999 Biomechanical lesion, unspecified: Secondary | ICD-10-CM

## 2021-01-12 DIAGNOSIS — M542 Cervicalgia: Secondary | ICD-10-CM | POA: Diagnosis not present

## 2021-01-12 DIAGNOSIS — M24552 Contracture, left hip: Secondary | ICD-10-CM

## 2021-01-12 NOTE — Assessment & Plan Note (Signed)
Tight hip flexor still noted on the left side.  He does have more tightness though of the L4 area.  Discussed icing regimen and home exercises.  Discussed which activities to do which wants to avoid.  Encourage patient to monitor.  Discussed different possible medication changes with her doing on the Zoloft.  Patient will follow up with me again in 6 to 8 weeks

## 2021-01-12 NOTE — Patient Instructions (Signed)
Xray cervical LBP exercises Continue Vit D Turmeric 500mg  daily See me in 5-6 weeks

## 2021-01-12 NOTE — Assessment & Plan Note (Signed)
Patient has had this since she has had her child.  Has been 2-1/2 months.  We discussed some ergonomics and proper lifting mechanics.  We will hold on any significant medication changes at this point.  Discussed posture and ergonomics and work with Event organiser to learn them in greater detail.  Follow-up again in 4 to 6 weeks.

## 2021-01-26 ENCOUNTER — Other Ambulatory Visit: Payer: Self-pay

## 2021-01-26 ENCOUNTER — Encounter: Payer: Self-pay | Admitting: Obstetrics and Gynecology

## 2021-01-26 ENCOUNTER — Ambulatory Visit (INDEPENDENT_AMBULATORY_CARE_PROVIDER_SITE_OTHER): Payer: 59 | Admitting: Obstetrics and Gynecology

## 2021-01-26 DIAGNOSIS — Z309 Encounter for contraceptive management, unspecified: Secondary | ICD-10-CM | POA: Insufficient documentation

## 2021-01-26 DIAGNOSIS — Z30016 Encounter for initial prescription of transdermal patch hormonal contraceptive device: Secondary | ICD-10-CM

## 2021-01-26 MED ORDER — NORELGESTROMIN-ETH ESTRADIOL 150-35 MCG/24HR TD PTWK: 1.0000 | MEDICATED_PATCH | TRANSDERMAL | 12 refills | Status: DC

## 2021-01-26 NOTE — Progress Notes (Signed)
Maureen [name] present for change in OCP's from progesterone only to patch. Was previously breastfeeding but now is bottle feeding. Has used contraceptive patch in the past without problems  Pap smear UTP  H/O depression on Zoloft and managed by Psych  PE AF VSS Lungs clear Heart RRR Abd soft + BS  A/P Contraceptive Management  Rx for Ortho Evra x 1 yr. U/R/B/Back up method reviewed with pt. F/U PRN or 1 yr

## 2021-01-26 NOTE — Patient Instructions (Signed)
Health Maintenance, Female Adopting a healthy lifestyle and getting preventive care are important in promoting health and wellness. Ask your health care provider about:  The right schedule for you to have regular tests and exams.  Things you can do on your own to prevent diseases and keep yourself healthy. What should I know about diet, weight, and exercise? Eat a healthy diet  Eat a diet that includes plenty of vegetables, fruits, low-fat dairy products, and lean protein.  Do not eat a lot of foods that are high in solid fats, added sugars, or sodium.   Maintain a healthy weight Body mass index (BMI) is used to identify weight problems. It estimates body fat based on height and weight. Your health care provider can help determine your BMI and help you achieve or maintain a healthy weight. Get regular exercise Get regular exercise. This is one of the most important things you can do for your health. Most adults should:  Exercise for at least 150 minutes each week. The exercise should increase your heart rate and make you sweat (moderate-intensity exercise).  Do strengthening exercises at least twice a week. This is in addition to the moderate-intensity exercise.  Spend less time sitting. Even light physical activity can be beneficial. Watch cholesterol and blood lipids Have your blood tested for lipids and cholesterol at 29 years of age, then have this test every 5 years. Have your cholesterol levels checked more often if:  Your lipid or cholesterol levels are high.  You are older than 29 years of age.  You are at high risk for heart disease. What should I know about cancer screening? Depending on your health history and family history, you may need to have cancer screening at various ages. This may include screening for:  Breast cancer.  Cervical cancer.  Colorectal cancer.  Skin cancer.  Lung cancer. What should I know about heart disease, diabetes, and high blood  pressure? Blood pressure and heart disease  High blood pressure causes heart disease and increases the risk of stroke. This is more likely to develop in people who have high blood pressure readings, are of African descent, or are overweight.  Have your blood pressure checked: ? Every 3-5 years if you are 18-39 years of age. ? Every year if you are 40 years old or older. Diabetes Have regular diabetes screenings. This checks your fasting blood sugar level. Have the screening done:  Once every three years after age 40 if you are at a normal weight and have a low risk for diabetes.  More often and at a younger age if you are overweight or have a high risk for diabetes. What should I know about preventing infection? Hepatitis B If you have a higher risk for hepatitis B, you should be screened for this virus. Talk with your health care provider to find out if you are at risk for hepatitis B infection. Hepatitis C Testing is recommended for:  Everyone born from 1945 through 1965.  Anyone with known risk factors for hepatitis C. Sexually transmitted infections (STIs)  Get screened for STIs, including gonorrhea and chlamydia, if: ? You are sexually active and are younger than 29 years of age. ? You are older than 29 years of age and your health care provider tells you that you are at risk for this type of infection. ? Your sexual activity has changed since you were last screened, and you are at increased risk for chlamydia or gonorrhea. Ask your health care provider   if you are at risk.  Ask your health care provider about whether you are at high risk for HIV. Your health care provider may recommend a prescription medicine to help prevent HIV infection. If you choose to take medicine to prevent HIV, you should first get tested for HIV. You should then be tested every 3 months for as long as you are taking the medicine. Pregnancy  If you are about to stop having your period (premenopausal) and  you may become pregnant, seek counseling before you get pregnant.  Take 400 to 800 micrograms (mcg) of folic acid every day if you become pregnant.  Ask for birth control (contraception) if you want to prevent pregnancy. Osteoporosis and menopause Osteoporosis is a disease in which the bones lose minerals and strength with aging. This can result in bone fractures. If you are 65 years old or older, or if you are at risk for osteoporosis and fractures, ask your health care provider if you should:  Be screened for bone loss.  Take a calcium or vitamin D supplement to lower your risk of fractures.  Be given hormone replacement therapy (HRT) to treat symptoms of menopause. Follow these instructions at home: Lifestyle  Do not use any products that contain nicotine or tobacco, such as cigarettes, e-cigarettes, and chewing tobacco. If you need help quitting, ask your health care provider.  Do not use street drugs.  Do not share needles.  Ask your health care provider for help if you need support or information about quitting drugs. Alcohol use  Do not drink alcohol if: ? Your health care provider tells you not to drink. ? You are pregnant, may be pregnant, or are planning to become pregnant.  If you drink alcohol: ? Limit how much you use to 0-1 drink a day. ? Limit intake if you are breastfeeding.  Be aware of how much alcohol is in your drink. In the [place] one drink equals one 12 oz bottle of beer (355 mL), one 5 oz glass of wine (148 mL), or one 1 oz glass of hard liquor (44 mL). General instructions  Schedule regular health, dental, and eye exams.  Stay current with your vaccines.  Tell your health care provider if: ? You often feel depressed. ? You have ever been abused or do not feel safe at home. Summary  Adopting a healthy lifestyle and getting preventive care are important in promoting health and wellness.  Follow your health care provider's instructions about healthy  diet, exercising, and getting tested or screened for diseases.  Follow your health care provider's instructions on monitoring your cholesterol and blood pressure. This information is not intended to replace advice given to you by your health care provider. Make sure you discuss any questions you have with your health care provider. Document Revised: 09/20/2018 Document Reviewed: 09/20/2018 Elsevier Patient Education  2021 Elsevier Inc.  

## 2021-02-16 NOTE — Progress Notes (Signed)
[name] [address] [place] [address] Phone: [phone] Subjective:   Maureen [name], am serving as a scribe for Dr. Antoine Primas. This visit occurred during the SARS-CoV-2 public health emergency.  Safety protocols were in place, including screening questions prior to the visit, additional usage of staff PPE, and extensive cleaning of exam room while observing appropriate contact time as indicated for disinfecting solutions.   I'm seeing this patient by the request  of:  Ardith Dark, MD  CC: Upper back and neck pain follow-up  [id number]  Maureen [name] is a 30 y.o. female coming in with complaint of back and neck pain. OMT 01/12/2021. Patient states that the last manipulation did not help her pain. Over past week pain has intensified. Pain in thoracic spine. L cervical spine pain as well. Did have ha for 3 days straight.  Patient states overall first week was much better.  Now starting to have increasing discomfort again.  Seems to be mostly in the parascapular region where most of the pain is and seems to be left greater than right she states.  No radiation to any of the extremities.  Medications patient has been prescribed: None           Reviewed prior external information including notes and imaging from previsou exam, outside providers and external EMR if available.   As well as notes that were available from care everywhere and other healthcare systems.  Past medical history, social, surgical and family history all reviewed in electronic medical record.  No pertanent information unless stated regarding to the chief complaint.   Past Medical History:  Diagnosis Date  . Asthma   . Bipolar 2 disorder (HCC) 2007  . Bipolar 2 disorder (HCC)   . Conversion disorder 2017  . Conversion disorder   . Depression 2007  . Heavy metal poisoning    s/p chelation  . Hypermobile joints   . Hypoglycemia   . PTSD  (post-traumatic stress disorder)   . Skin-picking disorder     Allergies  Allergen Reactions  . Cyclobenzaprine Other (See Comments)    Psychosis  . Penicillins Hives     Review of Systems:  No headache, visual changes, nausea, vomiting, diarrhea, constipation, dizziness, abdominal pain, skin rash, fevers, chills, night sweats, weight loss, swollen lymph nodes, body aches, joint swelling, chest pain, shortness of breath, mood changes. POSITIVE muscle aches  Objective  Blood pressure 102/70, pulse 79, height 5\' 8"  (1.727 m), weight 159 lb (72.1 kg), SpO2 95 %, unknown if currently breastfeeding.   General: No apparent distress alert and oriented x3 mood and affect normal, dressed appropriately.  HEENT: Pupils equal, extraocular movements intact  Respiratory: Patient's speak in full sentences and does not appear short of breath  Cardiovascular: No lower extremity edema, non tender, no erythema  Gait normal with good balance and coordination.  MSK:  Non tender with full range of motion and good stability and symmetric strength and tone of shoulders, elbows, wrist, hip, knee and ankles bilaterally.  Hypermobility noted Back -patient does have significant tightness noted in the parascapular region bilaterally.  Patient has 2 areas on the left side and one area on the right side.  Hypermobility of the shoulders is noted.   Osteopathic findings  C6 flexed rotated and side bent left T3 extended rotated and side bent right inhaled rib T7 extended rotated and side bent left L2 flexed rotated and side bent right Sacrum right on  right       Assessment and Plan:  Hypermobility syndrome Hypermobility still noted, discussed HEP, Discussed which activities to doing which wants to avoid.  I do believe that patient should do relatively well overall.  Does have some mild tightness of still noted in the hip flexor region.  Discussed the posture and ergonomics.  Continue the vitamin D  supplementation.  Follow-up with me again in 6  Hip flexor tightness, left Continue mild tightness noted.  Discussed icing regimen and home exercises.  Increase activity slowly.  I do believe the patient is going to make good progress.  Follow-up with me again 6 to 8 weeks    Nonallopathic problems  Decision today to treat with OMT was based on Physical Exam  After verbal consent patient was treated with HVLA, ME, FPR techniques in cervical, rib, thoracic, lumbar, and sacral  areas  Patient tolerated the procedure well with improvement in symptoms  Patient given exercises, stretches and lifestyle modifications  See medications in patient instructions if given  Patient will follow up in 4-8 weeks      The above documentation has been reviewed and is accurate and complete Judi Saa, DO       Note: This dictation was prepared with Dragon dictation along with smaller phrase technology. Any transcriptional errors that result from this process are unintentional.

## 2021-02-17 ENCOUNTER — Encounter: Payer: Self-pay | Admitting: Family Medicine

## 2021-02-17 ENCOUNTER — Ambulatory Visit (INDEPENDENT_AMBULATORY_CARE_PROVIDER_SITE_OTHER): Payer: 59 | Admitting: Family Medicine

## 2021-02-17 ENCOUNTER — Other Ambulatory Visit: Payer: Self-pay

## 2021-02-17 VITALS — BP 102/70 | HR 79 | Ht 68.0 in | Wt 159.0 lb

## 2021-02-17 DIAGNOSIS — M9902 Segmental and somatic dysfunction of thoracic region: Secondary | ICD-10-CM | POA: Diagnosis not present

## 2021-02-17 DIAGNOSIS — M357 Hypermobility syndrome: Secondary | ICD-10-CM

## 2021-02-17 DIAGNOSIS — M9903 Segmental and somatic dysfunction of lumbar region: Secondary | ICD-10-CM

## 2021-02-17 DIAGNOSIS — M9904 Segmental and somatic dysfunction of sacral region: Secondary | ICD-10-CM

## 2021-02-17 DIAGNOSIS — M24552 Contracture, left hip: Secondary | ICD-10-CM

## 2021-02-17 NOTE — Assessment & Plan Note (Signed)
Continue mild tightness noted.  Discussed icing regimen and home exercises.  Increase activity slowly.  I do believe the patient is going to make good progress.  Follow-up with me again 6 to 8 weeks

## 2021-02-17 NOTE — Patient Instructions (Signed)
Find time for yourself Good luck mattress shopping Beauty rest black Keep working on exercises See me in 6 weeks

## 2021-02-17 NOTE — Assessment & Plan Note (Addendum)
Hypermobility still noted, discussed HEP, Discussed which activities to doing which wants to avoid.  I do believe that patient should do relatively well overall.  Does have some mild tightness of still noted in the hip flexor region.  Discussed the posture and ergonomics.  Continue the vitamin D supplementation.  Follow-up with me again in 6

## 2021-04-01 NOTE — Progress Notes (Signed)
[name] [address] [place] [address] Phone: [phone] Subjective:   I Maureen [name] am serving as a Neurosurgeon for Dr. Antoine Primas.  This visit occurred during the SARS-CoV-2 public health emergency.  Safety protocols were in place, including screening questions prior to the visit, additional usage of staff PPE, and extensive cleaning of exam room while observing appropriate contact time as indicated for disinfecting solutions.   I'm seeing this patient by the request  of:  Ardith Dark, MD  CC: Neck and back pain follow-up  [id number]  Radha Nataliyah Packham is a 30 y.o. female coming in with complaint of back and neck pain. OMT 02/17/2021.  Patient has known hypermobility syndrome and neck pain.  Patient states she is better and the last adjustment lasted longer.   Medications patient has been prescribed: None       X-rays of cervical spine taken in January 12, 2021 were independently visualized by me showing no bony abnormality.   Reviewed prior external information including notes and imaging from previsou exam, outside providers and external EMR if available.   As well as notes that were available from care everywhere and other healthcare systems.  Past medical history, social, surgical and family history all reviewed in electronic medical record.  No pertanent information unless stated regarding to the chief complaint.   Past Medical History:  Diagnosis Date   Asthma    Bipolar 2 disorder (HCC) 2007   Bipolar 2 disorder (HCC)    Conversion disorder 2017   Conversion disorder    Depression 2007   Heavy metal poisoning    s/p chelation   Hypermobile joints    Hypoglycemia    PTSD (post-traumatic stress disorder)    Skin-picking disorder     Allergies  Allergen Reactions   Cyclobenzaprine Other (See Comments)    Psychosis   Penicillins Hives     Review of Systems:  No headache, visual changes, nausea, vomiting,  diarrhea, constipation, dizziness, abdominal pain, skin rash, fevers, chills, night sweats, weight loss, swollen lymph nodes, body aches, joint swelling, chest pain, shortness of breath, mood changes. POSITIVE muscle aches  Objective  Blood pressure 120/84, pulse 84, height 5\' 8"  (1.727 m), weight 155 lb (70.3 kg), SpO2 96 %, unknown if currently breastfeeding.   General: No apparent distress alert and oriented x3 mood and affect normal, dressed appropriately.  HEENT: Pupils equal, extraocular movements intact  Respiratory: Patient's speak in full sentences and does not appear short of breath  Cardiovascular: No lower extremity edema, non tender, no erythema  Hypermobility noted to multiple joints   Osteopathic findings  C6 flexed rotated and side bent left T6 extended rotated and side bent left  inhaled rib T9 extended rotated and side bent left L4 flexed rotated and side bent right Sacrum right on right     Assessment and Plan:  Left hip pain Continues to have the tenderness of the hip flexor as well as pain over the sacroiliac joint.  I discussed with patient about posture and alignment still.  We discussed potentially some over-the-counter orthotics that might help with stability as well.  Discussed which activities to do which wants to avoid.  Increase activity slowly.  Follow-up with me again 8 weeks.  Neck pain Continues to have some discomfort as well but responds fairly well to osteopathic manipulation.  Increase activity slowly.  Discussed posture and ergonomics.  Will be starting with a trainer.  We will may show  her scapular exercises at follow-up.   Nonallopathic problems  Decision today to treat with OMT was based on Physical Exam  After verbal consent patient was treated with HVLA, ME, FPR techniques in cervical, rib, thoracic, lumbar, and sacral  areas  Patient tolerated the procedure well with improvement in symptoms  Patient given exercises, stretches and  lifestyle modifications  See medications in patient instructions if given  Patient will follow up in 4-8 weeks     The above documentation has been reviewed and is accurate and complete Judi Saa, DO        Note: This dictation was prepared with Dragon dictation along with smaller phrase technology. Any transcriptional errors that result from this process are unintentional.

## 2021-04-02 ENCOUNTER — Encounter: Payer: Self-pay | Admitting: Family Medicine

## 2021-04-02 ENCOUNTER — Ambulatory Visit (INDEPENDENT_AMBULATORY_CARE_PROVIDER_SITE_OTHER): Payer: 59 | Admitting: Family Medicine

## 2021-04-02 ENCOUNTER — Other Ambulatory Visit: Payer: Self-pay

## 2021-04-02 VITALS — BP 120/84 | HR 84 | Ht 68.0 in | Wt 155.0 lb

## 2021-04-02 DIAGNOSIS — M25552 Pain in left hip: Secondary | ICD-10-CM

## 2021-04-02 DIAGNOSIS — M9904 Segmental and somatic dysfunction of sacral region: Secondary | ICD-10-CM | POA: Diagnosis not present

## 2021-04-02 DIAGNOSIS — M357 Hypermobility syndrome: Secondary | ICD-10-CM

## 2021-04-02 DIAGNOSIS — M9901 Segmental and somatic dysfunction of cervical region: Secondary | ICD-10-CM | POA: Diagnosis not present

## 2021-04-02 DIAGNOSIS — M9908 Segmental and somatic dysfunction of rib cage: Secondary | ICD-10-CM | POA: Diagnosis not present

## 2021-04-02 DIAGNOSIS — M542 Cervicalgia: Secondary | ICD-10-CM

## 2021-04-02 DIAGNOSIS — M9903 Segmental and somatic dysfunction of lumbar region: Secondary | ICD-10-CM | POA: Diagnosis not present

## 2021-04-02 DIAGNOSIS — M9902 Segmental and somatic dysfunction of thoracic region: Secondary | ICD-10-CM

## 2021-04-02 NOTE — Assessment & Plan Note (Signed)
Continues to have the tenderness of the hip flexor as well as pain over the sacroiliac joint.  I discussed with patient about posture and alignment still.  We discussed potentially some over-the-counter orthotics that might help with stability as well.  Discussed which activities to do which wants to avoid.  Increase activity slowly.  Follow-up with me again 8 weeks.

## 2021-04-02 NOTE — Assessment & Plan Note (Signed)
Continues to have some discomfort as well but responds fairly well to osteopathic manipulation.  Increase activity slowly.  Discussed posture and ergonomics.  Will be starting with a trainer.  We will may show her scapular exercises at follow-up.

## 2021-04-02 NOTE — Patient Instructions (Addendum)
Good to see you Keep it up  Start with a trainer.  Stay active [employer] "total support" online would be great  Chacos or berkrnstaulk shoes (sorry for spelling)  See me again in 8 weeks

## 2021-04-30 ENCOUNTER — Encounter: Payer: Self-pay | Admitting: Family Medicine

## 2021-05-08 ENCOUNTER — Encounter: Payer: Self-pay | Admitting: Family Medicine

## 2021-05-12 NOTE — Telephone Encounter (Signed)
Please advise 

## 2021-05-18 ENCOUNTER — Encounter: Payer: Self-pay | Admitting: Family Medicine

## 2021-05-18 ENCOUNTER — Telehealth (INDEPENDENT_AMBULATORY_CARE_PROVIDER_SITE_OTHER): Payer: 59 | Admitting: Family Medicine

## 2021-05-18 VITALS — Ht 69.0 in | Wt 159.0 lb

## 2021-05-18 DIAGNOSIS — F325 Major depressive disorder, single episode, in full remission: Secondary | ICD-10-CM

## 2021-05-18 MED ORDER — ESCITALOPRAM OXALATE 10 MG PO TABS: 10.0000 mg | ORAL_TABLET | Freq: Every day | ORAL | 5 refills | Status: DC

## 2021-05-18 NOTE — Progress Notes (Signed)
   Maureen [name] is a 30 y.o. female who presents today for a virtual office visit.  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  Currently seeing a therapist.  We will start Lexapro 10 mg daily.  Discussed potential side effects.  She will check with me in a few weeks via MyChart.  We will titrate dose as needed.  Would consider trial of 1 other SSRI before switching class of medications.  She has been on Zoloft in the past but did not feel like it is effective.     Subjective:  HPI:  See A/P for status of chronic conditions.  She has had worsening stress, anxiety, and depression over the last several months.  She has had a very stressful last several months.  Her father died suddenly earlier this year.  She also recently gave birth to her son about 7 months ago.  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  She has been seeing a therapist to deal with all the stress.  She has seen psychiatry in the past.  Is tried Zoloft but did not feel like it was effective.  It was recommended that she try Lexapro.       Objective/Observations  Physical Exam: Gen: NAD, resting comfortably Pulm: Normal work of breathing Neuro: Grossly normal, moves all extremities Psych: Normal affect and thought content  Virtual Visit via Video   I connected with Weyman Croon Bilal on 05/18/21 at  3:40 PM EDT by a video enabled telemedicine application and verified that I am speaking with the correct person using two identifiers. The limitations of evaluation and management by telemedicine and the availability of in person appointments were discussed. The patient expressed understanding and agreed to proceed.   Patient location: Home Provider location: [place] [employer] Persons participating in the virtual visit: Myself and tient     Katina Degree. Jimmey Ralph, MD 05/18/2021 10:34 AM

## 2021-05-18 NOTE — Assessment & Plan Note (Signed)
Not controlled.  Currently seeing a therapist.  We will start Lexapro 10 mg daily.  Discussed potential side effects.  She will check with me in a few weeks via MyChart.  We will titrate dose as needed.  Would consider trial of 1 other SSRI before switching class of medications.  She has been on Zoloft in the past but did not feel like it is effective.

## 2021-05-28 ENCOUNTER — Other Ambulatory Visit: Payer: Self-pay

## 2021-05-28 ENCOUNTER — Ambulatory Visit: Payer: Self-pay

## 2021-05-28 ENCOUNTER — Ambulatory Visit (INDEPENDENT_AMBULATORY_CARE_PROVIDER_SITE_OTHER): Payer: 59 | Admitting: Family Medicine

## 2021-05-28 VITALS — BP 100/72 | Ht 69.0 in | Wt 168.0 lb

## 2021-05-28 DIAGNOSIS — M25511 Pain in right shoulder: Secondary | ICD-10-CM

## 2021-05-28 DIAGNOSIS — M9901 Segmental and somatic dysfunction of cervical region: Secondary | ICD-10-CM

## 2021-05-28 DIAGNOSIS — M357 Hypermobility syndrome: Secondary | ICD-10-CM

## 2021-05-28 DIAGNOSIS — M9908 Segmental and somatic dysfunction of rib cage: Secondary | ICD-10-CM

## 2021-05-28 DIAGNOSIS — M9902 Segmental and somatic dysfunction of thoracic region: Secondary | ICD-10-CM

## 2021-05-28 DIAGNOSIS — M9903 Segmental and somatic dysfunction of lumbar region: Secondary | ICD-10-CM | POA: Diagnosis not present

## 2021-05-28 DIAGNOSIS — M9904 Segmental and somatic dysfunction of sacral region: Secondary | ICD-10-CM | POA: Diagnosis not present

## 2021-05-28 HISTORY — DX: Pain in right shoulder: M25.511

## 2021-05-28 NOTE — Patient Instructions (Signed)
Scapular exercises Up to you for PT OMT today Work on desk See me again in 6-8 weeks

## 2021-05-28 NOTE — Assessment & Plan Note (Signed)
No sign of any significant subluxation occurring at this time.  Do believe it is more secondary to posture and ergonomic discussed working positioning.  Patient has been scheduled for a physical therapy at another facility however it would be fine if she would like to be given some home exercises as well.  Responded well to manipulation.  Follow-up again 4 to 6 weeks

## 2021-05-28 NOTE — Assessment & Plan Note (Signed)
Patient had hypermobility syndrome discussed instability.  Shoulder seems to be intact.  Patient has good rotator cuff strength and on ultrasound no significant findings.  Responded well to manipulation.  Discussed icing regimen and home exercises.  Follow-up again 4 to 8 weeks

## 2021-05-28 NOTE — Progress Notes (Signed)
[name] [address] [place] [address] Phone: [phone] Subjective:   Maureen [name], am serving as a scribe for Dr. Antoine Primas.  This visit occurred during the SARS-CoV-2 public health emergency.  Safety protocols were in place, including screening questions prior to the visit, additional usage of staff PPE, and extensive cleaning of exam room while observing appropriate contact time as indicated for disinfecting solutions.   I'm seeing this patient by the request  of:  Ardith Dark, MD  CC: Back and neck pain  [id number]  Maureen [name] is a 30 y.o. female coming in with complaint of back and neck pain. OMT 04/02/2021. Patient did send a MyChart message about possible dislocated shoulder since last visit. Patient states that she went to UC following possible subluxation. Was seen at [facility] and recommended that patient see physical therapist. Noted issue with [facility] joint on L side and muscular strain in posterior L shoulder. Has not started PT yet.   Medications patient has been prescribed: None            Past Medical History:  Diagnosis Date   Asthma    Bipolar 2 disorder (HCC) 2007   Bipolar 2 disorder (HCC)    Conversion disorder 2017   Conversion disorder    Depression 2007   Heavy metal poisoning    s/p chelation   Hypermobile joints    Hypoglycemia    PTSD (post-traumatic stress disorder)    Skin-picking disorder     Allergies  Allergen Reactions   Cyclobenzaprine Other (See Comments)    Psychosis   Penicillins Hives     Review of Systems:  No headache, visual changes, nausea, vomiting, diarrhea, constipation, dizziness, abdominal pain, skin rash, fevers, chills, night sweats, weight loss, swollen lymph nodes, body aches, joint swelling, chest pain, shortness of breath, mood changes. POSITIVE muscle aches  Objective  Blood pressure 100/72, height 5\' 9"  (1.753 m), weight 168 lb (76.2 kg),  unknown if currently breastfeeding.   General: No apparent distress alert and oriented x3 mood and affect normal, dressed appropriately.  HEENT: Pupils equal, extraocular movements intact  Respiratory: Patient's speak in full sentences and does not appear short of breath  Cardiovascular: No lower extremity edema, non tender, no erythema  Right shoulder exam shows the patient has good range of motion.  Patient does have hypermobility of both.  Patient has no signs of impingement. Patient does have tightness noted of the right side of the thoracic spine noted.  Patient also has some mild tightness in the thoracolumbar juncture.  MSK performed of: Right This study was ordered, performed, and interpreted by [place] D.O.  Shoulder:   Supraspinatus:  Appears normal on long and transverse views, no bursal bulge seen with shoulder abduction on impingement view. Infraspinatus:  Appears normal on long and transverse views. Subscapularis:  Appears normal on long and transverse views. AC joint:  Capsule undistended, no geyser sign. Biceps Tendon:  Appears normal on long and transverse views, no fraying of tendon, tendon located in intertubercular groove, no subluxation with shoulder internal or external rotation. No increased power doppler signal. Impression: Normal shoulder  Osteopathic findings  C7 flexed rotated and side bent left T6 extended rotated and side bent right inhaled rib L2 flexed rotated and side bent right Sacrum right on right       Assessment and Plan: Hypermobility syndrome Patient had hypermobility syndrome discussed instability.  Shoulder seems to be intact.  Patient has good rotator cuff strength and on ultrasound no significant findings.  Responded well to manipulation.  Discussed icing regimen and home exercises.  Follow-up again 4 to 8 weeks  Right shoulder pain No sign of any significant subluxation occurring at this time.  Do believe it is more secondary to  posture and ergonomic discussed working positioning.  Patient has been scheduled for a physical therapy at another facility however it would be fine if she would like to be given some home exercises as well.  Responded well to manipulation.  Follow-up again 4 to 6 weeks    Nonallopathic problems  Decision today to treat with OMT was based on Physical Exam  After verbal consent patient was treated with HVLA, ME, FPR techniques in cervical, rib, thoracic, lumbar, and sacral  areas  Patient tolerated the procedure well with improvement in symptoms  Patient given exercises, stretches and lifestyle modifications  See medications in patient instructions if given  Patient will follow up in 4-8 weeks      The above documentation has been reviewed and is accurate and complete Judi Saa, DO       Note: This dictation was prepared with Dragon dictation along with smaller phrase technology. Any transcriptional errors that result from this process are unintentional.

## 2021-06-24 ENCOUNTER — Encounter: Payer: Self-pay | Admitting: Family Medicine

## 2021-06-24 ENCOUNTER — Ambulatory Visit (INDEPENDENT_AMBULATORY_CARE_PROVIDER_SITE_OTHER): Payer: 59 | Admitting: Family Medicine

## 2021-06-24 ENCOUNTER — Other Ambulatory Visit: Payer: Self-pay

## 2021-06-24 DIAGNOSIS — F325 Major depressive disorder, single episode, in full remission: Secondary | ICD-10-CM | POA: Diagnosis not present

## 2021-06-24 DIAGNOSIS — F319 Bipolar disorder, unspecified: Secondary | ICD-10-CM

## 2021-06-24 NOTE — Patient Instructions (Signed)
It was very nice to see you today!  I am glad that you are feeling better.  Is okay.  Stop Lexapro.  It will be easier to come off if you take half of the dose for the next week or so.  Please see the message in a few weeks to let me know how you are doing.  Take care, Dr Jimmey Ralph  PLEASE NOTE:  If you had any lab tests please let us know if you have not heard back within a few days. You may see your results on mychart before we have a chance to review them but we will give you a call once they are reviewed by [place]. If we ordered any referrals today, please let us know if you have not heard from their office within the next week.   Please try these tips to maintain a healthy lifestyle:  Eat at least 3 REAL meals and 1-2 snacks per day.  Aim for no more than 5 hours between eating.  If you eat breakfast, please do so within one hour of getting up.   Each meal should contain half fruits/vegetables, one quarter protein, and one quarter carbs (no bigger than a computer mouse)  Cut down on sweet beverages. This includes juice, soda, and sweet tea.   Drink at least 1 glass of water with each meal and aim for at least 8 glasses per day  Exercise at least 150 minutes every week.

## 2021-06-24 NOTE — Assessment & Plan Note (Signed)
Doing much better.  We will take Lexapro off the medication list and she will wean off for the next week or so.  She will check with me in a week or 2 via MyChart.  She will continue seeing her therapist.

## 2021-06-24 NOTE — Progress Notes (Signed)
   Maureen [name] is a 30 y.o. female who presents today for an office visit.  Assessment/Plan:  Chronic Problems Addressed Today: Major depression in remission [facility]) Doing much better.  We will take Lexapro off the medication list and she will wean off for the next week or so.  She will check with me in a week or 2 via MyChart.  She will continue seeing her therapist.  Bipolar disorder (HCC) Mood has been stable as above.  She will continue seeing her therapist.  She did not have any precipitation of manic symptoms with starting SSRIs.    Subjective:  HPI:  Patient here for depression/anxiety follow-up.  She was seen 6 weeks ago.  Started on Lexapro. Since our last visit, her husband has stopped taking his prescribed Cymbalta.  There was concern this was causing change in his mood and causing stress on their marriage and relationship.  Since stopping Cymbalta things have been much better at home.  Her mood is much better over the last several weeks. Does not think the lexapro has made a significant difference.        Objective:  Physical Exam: BP 110/70   Pulse 72   Temp 98.2 F (36.8 C) (Temporal)   Ht 5\' 9"  (1.753 m)   Wt 157 lb 12.8 oz (71.6 kg)   LMP 06/16/2021   SpO2 98%   BMI 23.30 kg/m   Gen: No acute distress, resting comfortably CV: Regular rate and rhythm with no murmurs appreciated Pulm: Normal work of breathing, clear to auscultation bilaterally with no crackles, wheezes, or rhonchi Neuro: Grossly normal, moves all extremities Psych: Normal affect and thought content      I,Jordan Kelly,acting as a scribe for 08/16/2021, MD.,have documented all relevant documentation on the behalf of Jacquiline Doe, MD,as directed by  Jacquiline Doe, MD while in the presence of Jacquiline Doe, MD.  I, Jacquiline Doe, MD, have reviewed all documentation for this visit. The documentation on 06/24/21 for the exam, diagnosis, procedures, and orders are all accurate and  complete.  06/26/21. Katina Degree, MD 06/24/2021 10:40 AM

## 2021-06-24 NOTE — Assessment & Plan Note (Signed)
Mood has been stable as above.  She will continue seeing her therapist.  She did not have any precipitation of manic symptoms with starting SSRIs.

## 2021-07-08 NOTE — Progress Notes (Signed)
[name] [address] [place] [address] Phone: [phone] Subjective:   Maureen [name], am serving as a scribe for Dr. Antoine Primas.  This visit occurred during the SARS-CoV-2 public health emergency.  Safety protocols were in place, including screening questions prior to the visit, additional usage of staff PPE, and extensive cleaning of exam room while observing appropriate contact time as indicated for disinfecting solutions.   I'm seeing this patient by the request  of:  Maureen Dark, MD  CC: Neck and back pain  [id number]  Maureen [name] is a 30 y.o. female coming in with complaint of back and neck pain. OMT on 05/28/2021. Also seen for right shoulder hypermobility and pain. Patient states that she hurt lower lumbar spine with L side braced and right side performing rowing motion. Patient was unable to walk well due to pain 3 days ago. Using Tylenol and hot water bottle treatments. Pain was radiating up into the L clavicle and L side of jaw.   Medications patient has been prescribed: None  Taking:         Reviewed prior external information including notes and imaging from previsou exam, outside providers and external EMR if available.   As well as notes that were available from care everywhere and other healthcare systems.  Past medical history, social, surgical and family history all reviewed in electronic medical record.  No pertanent information unless stated regarding to the chief complaint.   Past Medical History:  Diagnosis Date   Asthma    Bipolar 2 disorder (HCC) 2007   Bipolar 2 disorder (HCC)    Conversion disorder 2017   Conversion disorder    Depression 2007   Heavy metal poisoning    s/p chelation   Hypermobile joints    Hypoglycemia    PTSD (post-traumatic stress disorder)    Skin-picking disorder     Allergies  Allergen Reactions   Cyclobenzaprine Other (See Comments)    Psychosis    Penicillins Hives     Review of Systems:  No headache, visual changes, nausea, vomiting, diarrhea, constipation, dizziness, abdominal pain, skin rash, fevers, chills, night sweats, weight loss, swollen lymph nodes, body aches, joint swelling, chest pain, shortness of breath, mood changes. POSITIVE muscle aches  Objective  Blood pressure 112/72, pulse 97, height 5\' 9"  (1.753 m), weight 159 lb (72.1 kg), last menstrual period 06/16/2021, SpO2 98 %, unknown if currently breastfeeding.   General: No apparent distress alert and oriented x3 mood and affect normal, dressed appropriately.  HEENT: Pupils equal, extraocular movements intact  Respiratory: Patient's speak in full sentences and does not appear short of breath  Cardiovascular: No lower extremity edema, non tender, no erythema  Hypermobility noted to multiple joints. Patient does have tightness are noted of the left hip flexor noted.  Patient does does have tenderness over the right sacroiliac joint.  Mild positive 08/16/2021 on the right side.  Negative straight leg test bilaterally.  Osteopathic findings  C2 flexed rotated and side bent right C6 flexed rotated and side bent left T3 extended rotated and side bent right inhaled rib T9 extended rotated and side bent left L2 flexed rotated and side bent right Sacrum right on right       Assessment and Plan:  Hip flexor tightness, left Continue tightness of the left hip flexor.  He is having some difficulty  The right-sided sacroiliac joint as well.  I do believe that the hypermobility is still contributing.  Discussed posture and ergonomics and attempted to do which wants to avoid.  Increase activity slowly.  Follow-up with me again in 6 to 8 weeks   Nonallopathic problems  Decision today to treat with OMT was based on Physical Exam  After verbal consent patient was treated with HVLA, ME, FPR techniques in cervical, rib, thoracic, lumbar, and sacral  areas  Patient tolerated the  procedure well with improvement in symptoms  Patient given exercises, stretches and lifestyle modifications  See medications in patient instructions if given  Patient will follow up in 4-8 weeks      The above documentation has been reviewed and is accurate and complete Judi Saa, DO       Note: This dictation was prepared with Dragon dictation along with smaller phrase technology. Any transcriptional errors that result from this process are unintentional.

## 2021-07-09 ENCOUNTER — Encounter: Payer: Self-pay | Admitting: Family Medicine

## 2021-07-09 ENCOUNTER — Other Ambulatory Visit: Payer: Self-pay

## 2021-07-09 ENCOUNTER — Ambulatory Visit (INDEPENDENT_AMBULATORY_CARE_PROVIDER_SITE_OTHER): Payer: 59 | Admitting: Family Medicine

## 2021-07-09 VITALS — BP 112/72 | HR 97 | Ht 69.0 in | Wt 159.0 lb

## 2021-07-09 DIAGNOSIS — M357 Hypermobility syndrome: Secondary | ICD-10-CM | POA: Diagnosis not present

## 2021-07-09 DIAGNOSIS — M9908 Segmental and somatic dysfunction of rib cage: Secondary | ICD-10-CM | POA: Diagnosis not present

## 2021-07-09 DIAGNOSIS — M9904 Segmental and somatic dysfunction of sacral region: Secondary | ICD-10-CM

## 2021-07-09 DIAGNOSIS — M24552 Contracture, left hip: Secondary | ICD-10-CM | POA: Diagnosis not present

## 2021-07-09 DIAGNOSIS — M9903 Segmental and somatic dysfunction of lumbar region: Secondary | ICD-10-CM

## 2021-07-09 DIAGNOSIS — M9902 Segmental and somatic dysfunction of thoracic region: Secondary | ICD-10-CM

## 2021-07-09 DIAGNOSIS — M9901 Segmental and somatic dysfunction of cervical region: Secondary | ICD-10-CM

## 2021-07-09 NOTE — Patient Instructions (Signed)
Be careful moving large objects See me in 2 months

## 2021-07-09 NOTE — Assessment & Plan Note (Signed)
Continue tightness of the left hip flexor.  He is having some difficulty  The right-sided sacroiliac joint as well.  I do believe that the hypermobility is still contributing.  Discussed posture and ergonomics and attempted to do which wants to avoid.  Increase activity slowly.  Follow-up with me again in 6 to 8 weeks

## 2021-07-10 ENCOUNTER — Ambulatory Visit (INDEPENDENT_AMBULATORY_CARE_PROVIDER_SITE_OTHER): Payer: 59 | Admitting: Obstetrics and Gynecology

## 2021-07-10 ENCOUNTER — Other Ambulatory Visit (HOSPITAL_COMMUNITY)
Admission: RE | Admit: 2021-07-10 | Discharge: 2021-07-10 | Disposition: A | Discharge status: Home / Self Care | Payer: 59 | Source: Ambulatory Visit | Attending: Family Medicine | Admitting: Family Medicine

## 2021-07-10 ENCOUNTER — Encounter: Payer: Self-pay | Admitting: Obstetrics and Gynecology

## 2021-07-10 VITALS — BP 117/87 | HR 95 | Ht 68.0 in | Wt 157.7 lb

## 2021-07-10 DIAGNOSIS — N941 Unspecified dyspareunia: Secondary | ICD-10-CM

## 2021-07-10 NOTE — Progress Notes (Signed)
30 yo P1 presenting today for the evaluation of dyspareunia and decreased libido. Patient reports having a very active sex life with her previous fiance. This has not been the case with her husband of 7 years. She states that vaginal penetration is painful regardless of position. She denies any vaginal dryness. Her pain is located in her lower abdomen. She admits to a significant penile size difference between her husband and previous partner with her husband measuring 9 inches. She denies any vaginal discharge. Patient admits to depression but is not interested in meeting with behavioral health therapist. She denies suicidal or homicidal ideations  Past Medical History:  Diagnosis Date   Asthma    Bipolar 2 disorder (HCC) 2007   Bipolar 2 disorder (HCC)    Conversion disorder 2017   Conversion disorder    Depression 2007   Heavy metal poisoning    s/p chelation   Hypermobile joints    Hypoglycemia    PTSD (post-traumatic stress disorder)    Skin-picking disorder    Past Surgical History:  Procedure Laterality Date   MOUTH SURGERY N/A    .famh Social History   Tobacco Use   Smoking status: Never   Smokeless tobacco: Never  Vaping Use   Vaping Use: Never used  Substance Use Topics   Alcohol use: Not Currently    Comment: Rare   Drug use: Never   ROS See pertinent in HPI. All other systems reviewed and non contributory  Blood pressure 117/87, pulse 95, height 5\' 8"  (1.727 m), weight 157 lb 11.2 oz (71.5 kg), last menstrual period 06/16/2021, not currently breastfeeding. GENERAL: Well-developed, well-nourished female in no acute distress.  ABDOMEN: Soft, nontender, nondistended. No organomegaly. PELVIC: Normal external female genitalia. Vagina is pink and rugated.  Normal discharge. Normal appearing cervix. Uterus is normal in size. No adnexal mass or tenderness. Chaperone present during the pelvic exam EXTREMITIES: No cyanosis, clubbing, or edema, 2+ distal pulses.  A/P 30 yo  with dyspareunia - vaginal swab collected to rule out BV or yeast - Patient declined STI screening - Pelvic ultrasound ordered - Patient referred to physical therapy

## 2021-07-10 NOTE — Progress Notes (Signed)
Painful intercourse, low libdo x 7years and steadily getting worse. Loss of sensation after C-S 10/24/2020. Has elevated PHQ-9 and refuses [facility] referral at this time. Denies SI or plan.

## 2021-07-13 LAB — CERVICOVAGINAL ANCILLARY ONLY
Bacterial Vaginitis (gardnerella): NEGATIVE
Candida Glabrata: NEGATIVE
Candida Vaginitis: NEGATIVE
Comment: NEGATIVE
Comment: NEGATIVE
Comment: NEGATIVE

## 2021-07-15 ENCOUNTER — Other Ambulatory Visit: Payer: Self-pay

## 2021-07-15 ENCOUNTER — Ambulatory Visit (HOSPITAL_COMMUNITY)
Admission: RE | Admit: 2021-07-15 | Discharge: 2021-07-15 | Disposition: A | Discharge status: Home / Self Care | Payer: 59 | Source: Ambulatory Visit | Attending: Obstetrics and Gynecology | Admitting: Obstetrics and Gynecology

## 2021-07-15 DIAGNOSIS — N941 Unspecified dyspareunia: Secondary | ICD-10-CM | POA: Diagnosis present

## 2021-08-02 IMAGING — US US MFM FETAL BPP W/O NON-STRESS
1 series · 14 of 28 positions shown · non-contrast
Comparison: none

[Series 1: us mfm fetal bpp w/o non-stress · 69 acquisitions, 14 frames shown]
[im 3/69]
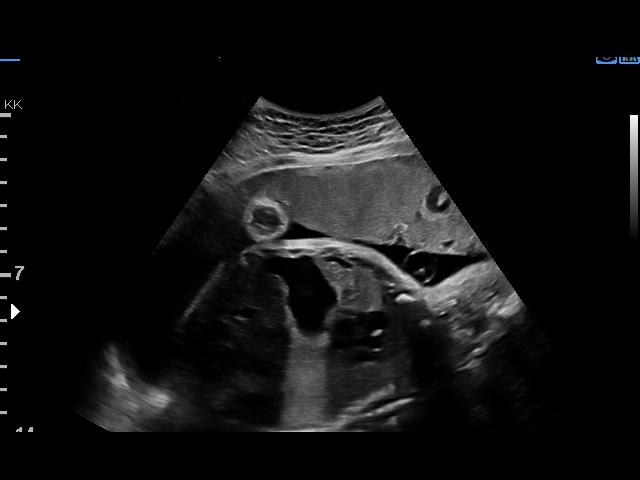
[im 8/69]
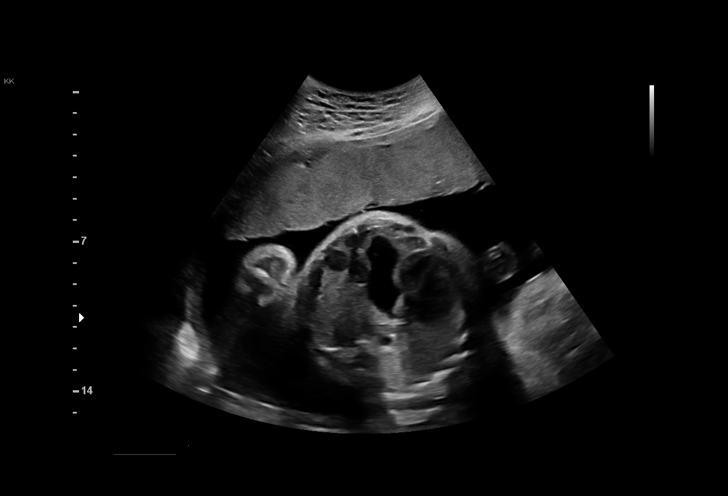
[im 13/69]
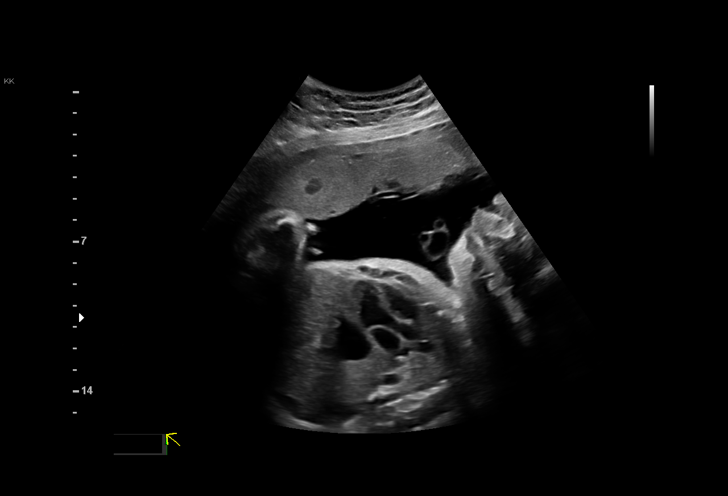
[im 18/69]
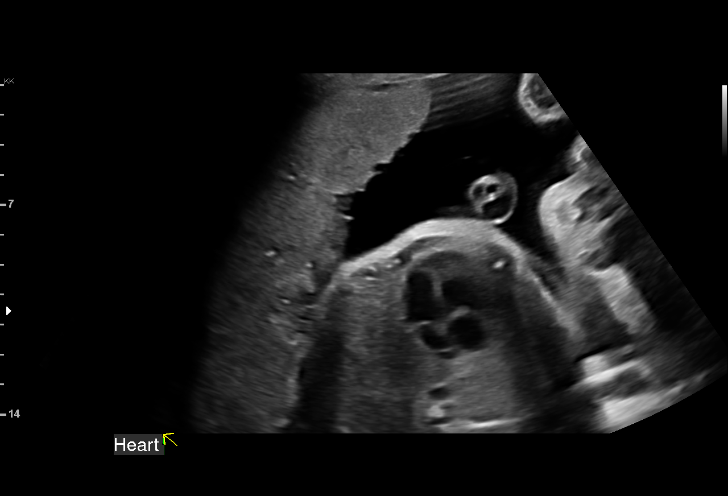
[im 23/69]
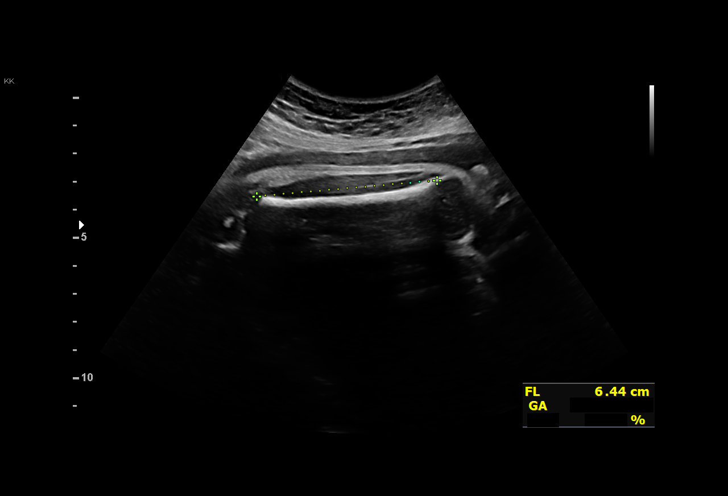
[im 28/69]
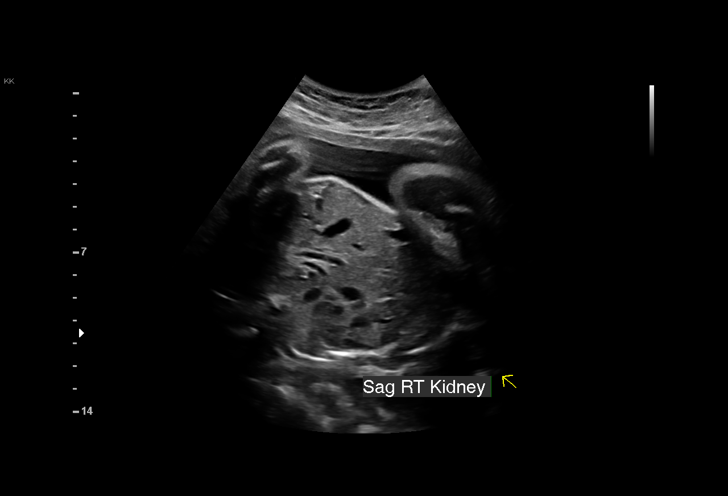
[im 33/69]
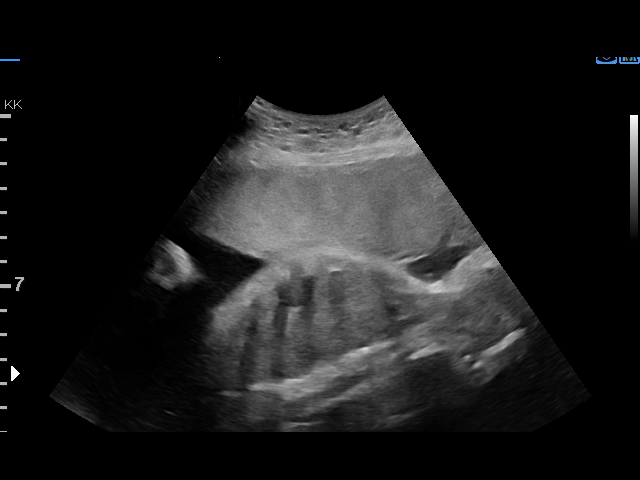
[im 38/69]
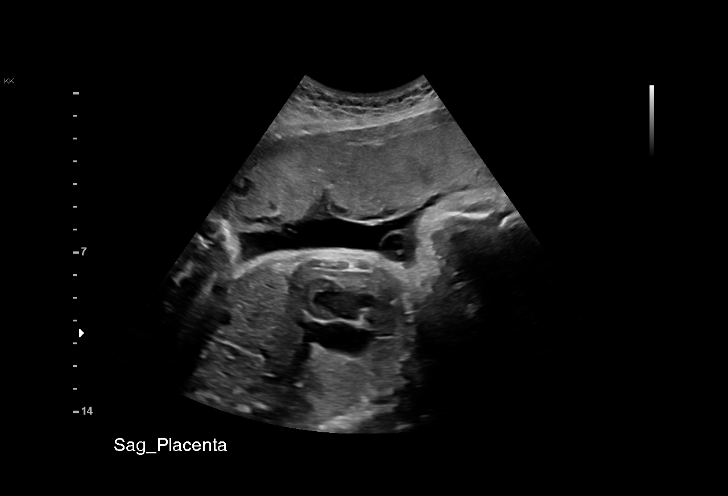
[im 43/69]
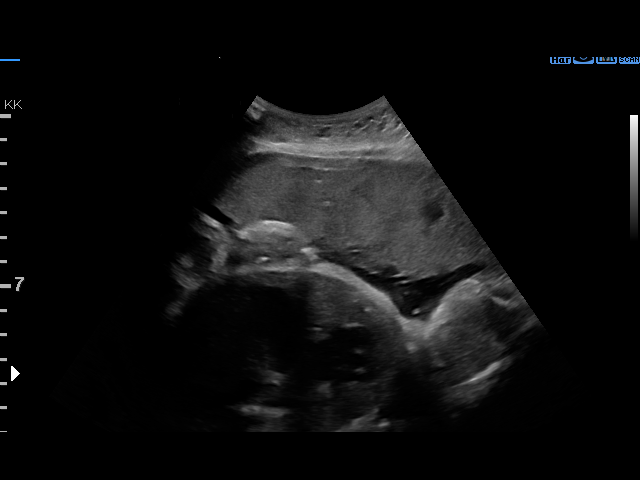
[im 48/69]
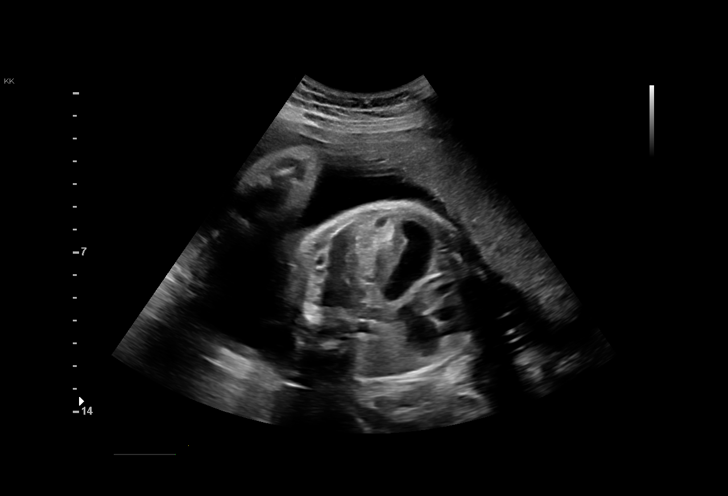
[im 53/69]
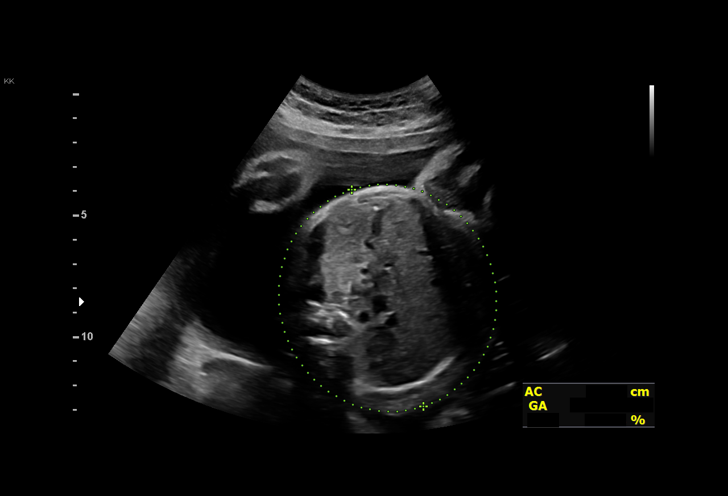
[im 58/69]
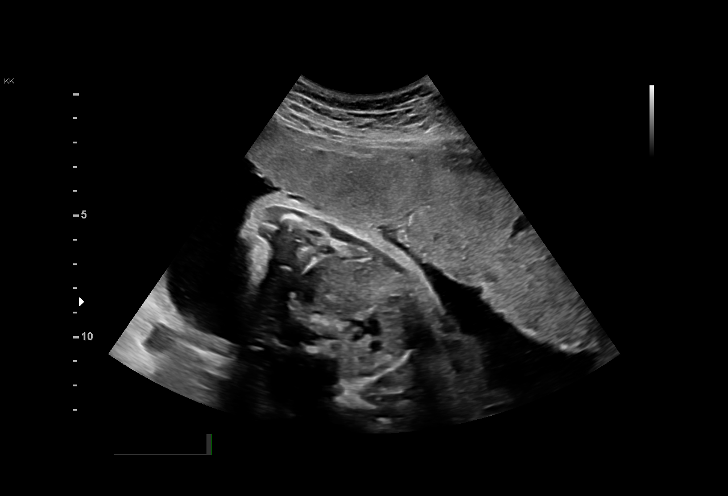
[im 63/69]
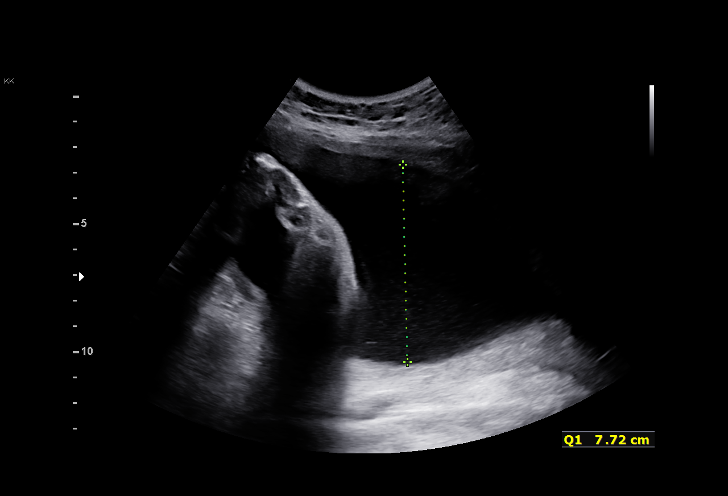
[im 69/69]
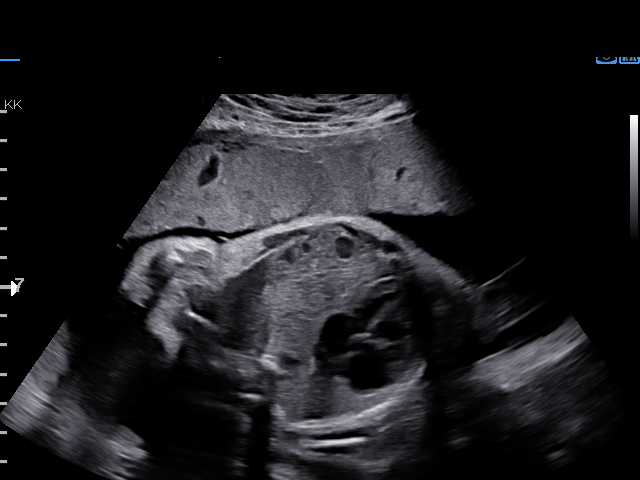

[14 of 28 positions shown; findings below may reference images not displayed]

SHI

Indications

 Pregnancy complicated by congenital            O99.89,
 diaphragmatic hernia
 Low Risk NIPS( Negative Horizon)
 Encounter for other antenatal screening
 follow-up
 33 weeks gestation of pregnancy
 Polyhydramnios, third trimester, antepartum
 condition or complication, unspecified fetus
Fetal Evaluation

 Num Of Fetuses:         1
 Fetal Heart Rate(bpm):  130
 Cardiac Activity:       Observed
 Presentation:           Cephalic
 Placenta:               Anterior
 P. Cord Insertion:      Previously Visualized

 Amniotic Fluid
 AFI FV:      Polyhydramnios

 AFI Sum(cm)     %Tile       Largest Pocket(cm)
 25.55           96

 RUQ(cm)       RLQ(cm)       LUQ(cm)        LLQ(cm)

Biophysical Evaluation

 Amniotic F.V:   Pocket => 2 cm             F. Tone:        Observed
 F. Movement:    Observed                   Score:          [DATE]
 F. Breathing:   Observed
Biometry

 BPD:      89.5  mm     G. Age:  36w 2d         99  %    CI:        75.71   %    70 - 86
                                                         FL/HC:      19.7   %    19.9 -
 HC:      326.1  mm     G. Age:  37w 0d         95  %    HC/AC:      1.13        0.96 -
 AC:      287.8  mm     G. Age:  32w 6d         41  %    FL/BPD:     72.0   %    71 - 87
 FL:       64.4  mm     G. Age:  33w 2d         41  %    FL/AC:      22.4   %    20 - 24

 Est. FW:    8830  gm    4 lb 15 oz      57  %
OB History

 Gravidity:    2
 TOP:          1
Gestational Age

 LMP:           33w 1d        Date:  01/23/20                 EDD:   10/29/20
 U/S Today:     34w 6d                                        EDD:   10/17/20
 Best:          33w 1d     Det. By:  LMP  (01/23/20)          EDD:   10/29/20
Anatomy

 Cranium:               Appears normal         LVOT:                   Appears normal
 Cavum:                 Not well visualized    Aortic Arch:            Not well visualized
 Ventricles:            Appears normal         Ductal Arch:            Not well visualized
 Choroid Plexus:        Previously seen        Diaphragm:              Congenital
                                                                       diaphragmatic
                                                                       hernia
 Cerebellum:            Previously seen        Stomach:                Intrathoracic
 Posterior Fossa:       Previously seen        Abdomen:                Abnormal, see
                                                                       comments
 Nuchal Fold:           Not applicable (>20    Abdominal Wall:         Previously seen
                        wks GA)
 Face:                  Absent nasal bone      Cord Vessels:           Previously seen
 Lips:                  Previously seen        Kidneys:                Appear normal
 Palate:                Previously seen        Bladder:                Appears normal
 Thoracic:              Stomach and            Spine:                  Previously seen
                        bowel in left chest
 Heart:                 Abnormal, see          Upper Extremities:      Previously seen
                        comments
 RVOT:                  Appears normal         Lower Extremities:      Previously seen

 Other:  Technically difficult due to fetal position.
Cervix Uterus Adnexa

 Cervix
 Not visualized (advanced GA >82wks)
Impression

 Left-sided congenital diaphragmatic hernia.  Patient had
 amniocentesis and karyotype including MicroArray are
 normal.  She has an MFM appointment at [REDACTED] next week.

 On today's ultrasound, fetal growth is appropriate for
 gestational age.  Mild polyhydramnios is seen. Good fetal
 activity is present.  Antenatal testing is reassuring.  BPP [DATE].
 Left-sided CDH is seen with stomach and small bowels in the
 left chest.  The contralateral lung appears normal.  Heart is
 pushed to the right with the apex pointing to the left.
 Lung head ratio is 1.9 (good prognosis).  No evidence of
 hydrops.

 I reassured the patient of the findings including mild
 polyhydramnios.
Recommendations

 -Patient has an appointment with MFM, Char[facility] next week.
 -BPP in 2 weeks and then weekly till delivery.
 -Delivery at [REDACTED[facility] Ctr.
                 Charqui

## 2021-08-03 ENCOUNTER — Encounter: Payer: Self-pay | Admitting: Family Medicine

## 2021-08-14 ENCOUNTER — Other Ambulatory Visit: Payer: Self-pay

## 2021-08-14 ENCOUNTER — Ambulatory Visit: Payer: 59 | Attending: Obstetrics and Gynecology | Admitting: Physical Therapy

## 2021-08-14 ENCOUNTER — Encounter: Payer: Self-pay | Admitting: Physical Therapy

## 2021-08-14 DIAGNOSIS — R279 Unspecified lack of coordination: Secondary | ICD-10-CM | POA: Diagnosis present

## 2021-08-14 DIAGNOSIS — M6281 Muscle weakness (generalized): Secondary | ICD-10-CM | POA: Diagnosis present

## 2021-08-14 DIAGNOSIS — R252 Cramp and spasm: Secondary | ICD-10-CM | POA: Insufficient documentation

## 2021-08-14 NOTE — Therapy (Signed)
[facility] [address] [place], [place], [address] Phone: [phone]   Fax:  [phone]  Physical Therapy Evaluation  Patient Details  Name: Maureen [name] MRN: [id number] Date of Birth: 1991/04/09 Referring Provider (PT): Catalina Antigua, MD   Encounter Date: 08/14/2021   PT End of Session - 08/14/21 1204     Visit Number 1    Date for PT Re-Evaluation 11/14/21    Authorization Type [employer]    PT Start Time 1016    PT Stop Time 1100    PT Time Calculation (min) 44 min    Behavior During Therapy [facility] for tasks assessed/performed             Past Medical History:  Diagnosis Date   Asthma    Bipolar 2 disorder (HCC) 2007   Bipolar 2 disorder (HCC)    Conversion disorder 2017   Conversion disorder    Depression 2007   Heavy metal poisoning    s/p chelation   Hypermobile joints    Hypoglycemia    PTSD (post-traumatic stress disorder)    Skin-picking disorder     Past Surgical History:  Procedure Laterality Date   MOUTH SURGERY N/A     There were no vitals filed for this visit.    Subjective Assessment - 08/14/21 1019     Subjective Pt reports 7 years she has had painful sex with husband since meeting him and reports she feels this is due to his larger size and hurts with intercourse. Pt does endorse dryness occasionally, also reports some senstivity at c-section scar site birth 10/2020.    Pertinent History c-section 1/22, depression, bipolar 2 disorder, conversion disorder, hypermobile joints, PTSD    How long can you sit comfortably? no limits    How long can you stand comfortably? no limits    How long can you walk comfortably? no limits    Patient Stated Goals to have less pain with intercourse    Currently in Pain? No/denies   pt reports only pain is felt with intercourse and can be as bad as 10/10 does take several hours to relieve pain once at this level and soreness noted next day.                [facility] PT Assessment - 08/14/21 0001       Assessment   Medical Diagnosis N94.10 (ICD-10-CM) - Dyspareunia in female    Referring Provider (PT) Constant, Peggy, MD    Onset Date/Surgical Date --   7 years since having intercourse with husband   Prior Therapy not for PFPT      Precautions   Precautions Other (comment)   10 months postpartum     Restrictions   Weight Bearing Restrictions No      Balance Screen   Has the patient fallen in the past 6 months No    Has the patient had a decrease in activity level because of a fear of falling?  No    Is the patient reluctant to leave their home because of a fear of falling?  No      Home Environment   Living Environment Private residence    Living Arrangements Spouse/significant other;Children      Prior Function   Level of Independence Independent      Cognition   Overall Cognitive Status Within Functional Limits for tasks assessed      Sensation   Light Touch Impaired Detail  Coordination   Gross Motor Movements are Fluid and Coordinated Yes    Fine Motor Movements are Fluid and Coordinated Yes      Posture/Postural Control   Posture/Postural Control No significant limitations      ROM / Strength   AROM / PROM / Strength AROM;Strength      AROM   Overall AROM Comments WFL      Strength   Overall Strength Comments WFL      Flexibility   Soft Tissue Assessment /Muscle Length yes   WFL                No emotional/communication barriers or cognitive limitation. Patient is motivated to learn. Patient understands and agrees with treatment goals and plan. PT explains patient will be examined in standing, sitting, and lying down to see how their muscles and joints work. When they are ready, they will be asked to remove their underwear so PT can examine their perineum. The patient is also given the option of providing their own chaperone as one is not provided in our facility. The patient also  has the right and is explained the right to defer or refuse any part of the evaluation or treatment including the internal exam. With the patient's consent, PT will use one gloved finger to gently assess the muscles of the pelvic floor, seeing how well it contracts and relaxes and if there is muscle symmetry. After, the patient will get dressed and PT and patient will discuss exam findings and plan of care. PT and patient discuss plan of care, schedule, attendance policy and HEP activities.      Objective measurements completed on examination: See above findings.     Pelvic Floor Special Questions - 08/14/21 0001     Are you Pregnant or attempting pregnancy? No    Prior Pregnancies Yes    Number of Pregnancies 2    Number of C-Sections 1    Currently Sexually Active Yes    Is this Painful Yes    History of sexually transmitted disease No    Marinoff Scale pain interrupts completion    Urinary Leakage No    Urinary urgency No    Urinary frequency no    Fecal incontinence No    Fluid intake yes    Caffeine beverages 160-200mg  caffeine per day    Falling out feeling (prolapse) No    Prolapse Anterior Wall   pt demonstrated mild possible grade 1 prolapse in hooklying noted   Pelvic Floor Internal Exam patient identified and patient confirms consent for PT to perform internal soft tissue work and muscle strength and integrity assessment    Exam Type Vaginal    Sensation pt reports mild dulled sensation at vulva area since starting anti depressant but now off these recently    Palpation no TTP throughout exam    Strength good squeeze, good lift, able to hold agaisnt strong resistance    Strength # of reps 8    Strength # of seconds 8    Tone [employer]                       PT Education - 08/14/21 1203     Education Details pt educated on exam findings, resources for sexual counseling, lubricants and Oh Nut products    Person(s) Educated Patient    Methods  Explanation;Demonstration;Tactile cues;Verbal cues;Handout    Comprehension Verbalized understanding;Returned demonstration  PT Short Term Goals - 08/14/21 1211       PT SHORT TERM GOAL #1   Title pt to be I with HEP    Time 4    Period Weeks    Status New    Target Date 09/11/21      PT SHORT TERM GOAL #2   Title pt report tolerance to vaginal penetration with pain no more than 5/10 with dilator size 4 in large set for improved tolerance to intercourse    Time 4    Period Weeks    Status New    Target Date 09/11/21               PT Long Term Goals - 08/14/21 1212       PT LONG TERM GOAL #1   Title pt to be I with advanced HEP    Time 3    Period Months    Status New    Target Date 11/14/21      PT LONG TERM GOAL #2   Title pt to tolerate dilator in large set size 4 or equivalent with no more than 2/10 pain for improved tolerance to intercourse    Time 3    Period Months    Status New    Target Date 11/14/21      PT LONG TERM GOAL #3   Title pt to demonstrate improved ability with breathing mechanics and pelvic floor contract/relax with activities at least 75% of the time to decrease prolapse grade    Time 3    Period Months    Status New    Target Date 11/14/21                    Plan - 08/14/21 1206     Clinical Impression Statement Pt is 30yo female, 10 months postpartum via c-section with first and only child, presents to clinic with 7 year history of pain with intercourse. Pt reports pain stops her from ever having had orgasm and sometimes prevents attempts at intercourse. Pt reports she is "demi-sexual" and this also limits her desire to wanting to have intercourse, has noticed some dryness and reports her spouse does not enjoy outercourse and therefore she finds it difficult to become aroused to decrease pain with intercourse. Pt found to have [facility] strength and mobility externally, does report some senstivity at c-section scar  site and consented to internal vaginal pelvic floor assessment. Pt found to have no tightness, WFL tone, and no TTP with assessment. Pt tolerated well demonstrated 4/5 contraction strength some mild decreased endurance and was found to have possible grade one prolapse in hooklying position. Pt educated on increasing outercourse and use of lubricants and samples given, ohnut product for decresed pain levels and given information on resources for sexual counseling. Pt requested more information for preventing prolapse from worsening and pelvic relaxation techniques to attempt prior to sex and would benefit from additional sessions for this.    Personal Factors and Comorbidities Comorbidity 1;Time since onset of injury/illness/exacerbation    Comorbidities postpartum    Examination-Activity Limitations Other   intercourse   Examination-Participation Restrictions Interpersonal Relationship    Stability/Clinical Decision Making Stable/Uncomplicated    Clinical Decision Making Low    Rehab Potential Good    PT Frequency 1x / week    PT Duration 12 weeks    PT Treatment/Interventions ADLs/Self Care Home Management;Aquatic Therapy;Functional mobility training;Therapeutic activities;Therapeutic exercise;Neuromuscular re-education;Manual techniques;Patient/family education;Taping;Scar mobilization;Passive range of motion;Energy conservation  PT Next Visit Plan go over relaxation of pelvis and breathing techniques for prolapse    Consulted and Agree with Plan of Care Patient             Patient will benefit from skilled therapeutic intervention in order to improve the following deficits and impairments:  Decreased strength, Decreased endurance  Visit Diagnosis: Cramp and spasm - Plan: PT plan of care cert/re-cert     Problem List Patient Active Problem List   Diagnosis Date Noted   Right shoulder pain 05/28/2021   Contraception management 01/26/2021   Neck pain 01/12/2021   Skin eruption  06/29/2020   Nonallopathic lesion of sacral region 03/05/2019   Nonallopathic lesion of lumbosacral region 03/05/2019   Nonallopathic lesion of thoracic region 03/05/2019   Hip flexor tightness, left 02/21/2019   Vitamin D deficiency 02/21/2019   Hypermobility syndrome 02/21/2019   Left hip pain 01/03/2019   Bipolar disorder (HCC) 01/03/2019   PTSD (post-traumatic stress disorder) 01/03/2019   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

## 2021-08-20 ENCOUNTER — Ambulatory Visit: Payer: 59 | Admitting: Physical Therapy

## 2021-08-24 ENCOUNTER — Other Ambulatory Visit: Payer: Self-pay

## 2021-08-24 ENCOUNTER — Ambulatory Visit: Payer: 59 | Admitting: Physical Therapy

## 2021-08-24 DIAGNOSIS — M6281 Muscle weakness (generalized): Secondary | ICD-10-CM

## 2021-08-24 DIAGNOSIS — R279 Unspecified lack of coordination: Secondary | ICD-10-CM

## 2021-08-24 DIAGNOSIS — R252 Cramp and spasm: Secondary | ICD-10-CM | POA: Diagnosis not present

## 2021-08-24 NOTE — Therapy (Signed)
[facility] [address] [place], [place], [address] Phone: [phone]   Fax:  [phone]  Physical Therapy Treatment  Patient Details  Name: Maureen [name] MRN: [id number] Date of Birth: [birth year] Referring Provider (PT): Catalina Antigua, MD   Encounter Date: 08/24/2021   PT End of Session - 08/24/21 1141     Visit Number 2    Date for PT Re-Evaluation 11/14/21    Authorization Type [employer]    PT Start Time 1103    PT Stop Time 1143    PT Time Calculation (min) 40 min    Activity Tolerance Patient tolerated treatment well    Behavior During Therapy [facility] for tasks assessed/performed             Past Medical History:  Diagnosis Date   Asthma    Bipolar 2 disorder (HCC) 2007   Bipolar 2 disorder (HCC)    Conversion disorder 2017   Conversion disorder    Depression 2007   Heavy metal poisoning    s/p chelation   Hypermobile joints    Hypoglycemia    PTSD (post-traumatic stress disorder)    Skin-picking disorder     Past Surgical History:  Procedure Laterality Date   MOUTH SURGERY N/A     There were no vitals filed for this visit.   Subjective Assessment - 08/24/21 1106     Subjective Pt reports she did attempt different positions attempted during intercourse based on handout given at eval and this did help with pelvic pain however increased back/hip pain.    Pertinent History c-section 1/22, depression, bipolar 2 disorder, conversion disorder, hypermobile joints, PTSD    How long can you sit comfortably? no limits    How long can you stand comfortably? no limits    How long can you walk comfortably? no limits    Patient Stated Goals to have less pain with intercourse    Currently in Pain? No/denies                               [facility] Adult PT Treatment/Exercise - 08/24/21 0001       Exercises   Exercises Lumbar;Knee/Hip      Lumbar Exercises: Standing    Functional Squats 10 reps    Functional Squats Limitations 10# kettle bell      Lumbar Exercises: Seated   Other Seated Lumbar Exercises hip shifts x10 toes neutral, toes in and toes out; x10      Lumbar Exercises: Supine   Bridge 10 reps    Other Supine Lumbar Exercises same side arm/knee press x10    Other Supine Lumbar Exercises unilateral green band hip adduction 10 each                     PT Education - 08/24/21 1141     Education Details Pt educated on breathing mechanics with all exercises.    Person(s) Educated Patient    Methods Explanation;Demonstration;Tactile cues;Verbal cues    Comprehension Verbalized understanding;Returned demonstration              PT Short Term Goals - 08/14/21 1211       PT SHORT TERM GOAL #1   Title pt to be I with HEP    Time 4    Period Weeks    Status New    Target Date 09/11/21  PT SHORT TERM GOAL #2   Title pt report tolerance to vaginal penetration with pain no more than 5/10 with dilator size 4 in large set for improved tolerance to intercourse    Time 4    Period Weeks    Status New    Target Date 09/11/21               PT Long Term Goals - 08/14/21 1212       PT LONG TERM GOAL #1   Title pt to be I with advanced HEP    Time 3    Period Months    Status New    Target Date 11/14/21      PT LONG TERM GOAL #2   Title pt to tolerate dilator in large set size 4 or equivalent with no more than 2/10 pain for improved tolerance to intercourse    Time 3    Period Months    Status New    Target Date 11/14/21      PT LONG TERM GOAL #3   Title pt to demonstrate improved ability with breathing mechanics and pelvic floor contract/relax with activities at least 75% of the time to decrease prolapse grade    Time 3    Period Months    Status New    Target Date 11/14/21                   Plan - 08/24/21 1142     Clinical Impression Statement Pt presents to clinic with continued pain with  intercourse however pt attempted various positions and reported these helped with pain with penetration or deeper pain but then caused increased pain at back or hip. Pt reports she does think she has stress with life in general that could contribute to pelvic pain, is in therapy for this with husband as well. Pt session focused on core and hip strength with proper breathing mechanics to decrease stress at pelvic floor and prevent worsening of prolapse. Pt tolerated session well, did need increased cues for breathing mechanics and pt feels she holds her breath a lot for tasks at home. Pt would benefit from additional PT to further address deficits.    Personal Factors and Comorbidities Comorbidity 1;Time since onset of injury/illness/exacerbation    Comorbidities postpartum    Examination-Activity Limitations Other   intercourse   Examination-Participation Restrictions Interpersonal Relationship    Stability/Clinical Decision Making Stable/Uncomplicated    Rehab Potential Good    PT Frequency 1x / week    PT Duration 12 weeks    PT Treatment/Interventions ADLs/Self Care Home Management;Aquatic Therapy;Functional mobility training;Therapeutic activities;Therapeutic exercise;Neuromuscular re-education;Manual techniques;Patient/family education;Taping;Scar mobilization;Passive range of motion;Energy conservation    PT Next Visit Plan go over relaxation of pelvis and breathing techniques for prolapse    Consulted and Agree with Plan of Care Patient             Patient will benefit from skilled therapeutic intervention in order to improve the following deficits and impairments:  Decreased strength, Decreased endurance  Visit Diagnosis: Muscle weakness (generalized)  Lack of coordination     Problem List Patient Active Problem List   Diagnosis Date Noted   Right shoulder pain 05/28/2021   Contraception management 01/26/2021   Neck pain 01/12/2021   Skin eruption 06/29/2020    Nonallopathic lesion of sacral region 03/05/2019   Nonallopathic lesion of lumbosacral region 03/05/2019   Nonallopathic lesion of thoracic region 03/05/2019   Hip flexor tightness, left 02/21/2019  Vitamin D deficiency 02/21/2019   Hypermobility syndrome 02/21/2019   Left hip pain 01/03/2019   Bipolar disorder (HCC) 01/03/2019   PTSD (post-traumatic stress disorder) 01/03/2019   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 [place], [place], [address] Phone: [phone]   Fax:  [phone]  Name: Maureen [name] MRN: [id number] Date of Birth: 1991-10-02

## 2021-09-07 ENCOUNTER — Ambulatory Visit: Payer: 59 | Admitting: Physical Therapy

## 2021-09-07 NOTE — Progress Notes (Signed)
Maureen [name]: [phone] Subjective:   [name], am serving as a scribe for Dr. Hulan Saas. This visit occurred during the SARS-CoV-2 public health emergency.  Safety protocols were in place, including screening questions prior to the visit, additional usage of staff PPE, and extensive cleaning of exam room while observing appropriate contact time as indicated for disinfecting solutions.   I'm seeing this patient by the request  of:  Vivi Barrack, MD  CC: Back pain and neck pain  [id number]  Rhyder Gohar Mckeag is a 30 y.o. female coming in with complaint of back and neck pain. OMT on 07/08/2021. Patient states that she is having an increase in L shoulder and L cervical spine that is intermittent. Moving C spine and flexing L shoulder increases her pain.  Has been using Tylenol for pain relief.   Medications patient has been prescribed: None  Taking:         Reviewed prior external information including notes and imaging from previsou exam, outside providers and external EMR if available.   As well as notes that were available from care everywhere and other healthcare systems.  Past medical history, social, surgical and family history all reviewed in electronic medical record.  No pertanent information unless stated regarding to the chief complaint.   Past Medical History:  Diagnosis Date   Asthma    Bipolar 2 disorder ([place]) 2007   Bipolar 2 disorder ([place])    Conversion disorder 2017   Conversion disorder    Depression 2007   Heavy metal poisoning    s/p chelation   Hypermobile joints    Hypoglycemia    PTSD (post-traumatic stress disorder)    Skin-picking disorder     Allergies  Allergen Reactions   Cyclobenzaprine Other (See Comments)    Psychosis   Penicillins Hives     Review of Systems:  No headache, visual changes, nausea, vomiting, diarrhea, constipation,  dizziness, abdominal pain, skin rash, fevers, chills, night sweats, weight loss, swollen lymph nodes, body aches, joint swelling, chest pain, shortness of breath, mood changes. POSITIVE muscle aches  Objective  Blood pressure 134/90, pulse 89, height 5\' 8"  (1.727 m), weight 158 lb (71.7 kg), SpO2 98 %.   General: No apparent distress alert and oriented x3 mood and affect normal, dressed appropriately.  HEENT: Pupils equal, extraocular movements intact  Respiratory: Patient's speak in full sentences and does not appear short of breath  Cardiovascular: No lower extremity edema, non tender, no erythema  Significant increase in tightness noted of the cervical spine.  Seems to be more on the left.  Patient does have a tenderness over the trapezius.  Negative Spurling's.  Tightness noted in the thoracolumbar as well as at the lumbosacral areas bilaterally.  Negative straight leg test on 5 out of 5 strength.  Osteopathic findings  C3 flexed rotated and side bent left C6 flexed rotated and side bent left T7 extended rotated and side bent left inhaled rib L2 flexed rotated and side bent right Sacrum right on right       Assessment and Plan:  Neck pain Chronic problem with mild worsening pain.  Patient does have hypermobility noted.  Do believe the patient did have more of a muscle injury though.  Discussed icing regimen and home exercises.  Discussed avoiding certain activities.  Follow-up with me again in 4 to 8 weeks.  Patient has responded well to manipulation previously and hopefully  will continue to do so.   Nonallopathic problems  Decision today to treat with OMT was based on Physical Exam  After verbal consent patient was treated with HVLA, ME, FPR techniques in cervical, rib, thoracic, lumbar, and sacral  areas  Patient tolerated the procedure well with improvement in symptoms  Patient given exercises, stretches and lifestyle modifications  See medications in patient instructions  if given  Patient will follow up in 4-8 weeks      The above documentation has been reviewed and is accurate and complete Judi Saa, DO        Note: This dictation was prepared with Dragon dictation along with smaller phrase technology. Any transcriptional errors that result from this process are unintentional.

## 2021-09-08 ENCOUNTER — Other Ambulatory Visit: Payer: Self-pay

## 2021-09-08 ENCOUNTER — Encounter: Payer: Self-pay | Admitting: Family Medicine

## 2021-09-08 ENCOUNTER — Ambulatory Visit (INDEPENDENT_AMBULATORY_CARE_PROVIDER_SITE_OTHER): Payer: 59 | Admitting: Family Medicine

## 2021-09-08 VITALS — BP 134/90 | HR 89 | Ht 68.0 in | Wt 158.0 lb

## 2021-09-08 DIAGNOSIS — M542 Cervicalgia: Secondary | ICD-10-CM

## 2021-09-08 DIAGNOSIS — M9903 Segmental and somatic dysfunction of lumbar region: Secondary | ICD-10-CM

## 2021-09-08 DIAGNOSIS — M9902 Segmental and somatic dysfunction of thoracic region: Secondary | ICD-10-CM | POA: Diagnosis not present

## 2021-09-08 DIAGNOSIS — M9908 Segmental and somatic dysfunction of rib cage: Secondary | ICD-10-CM | POA: Diagnosis not present

## 2021-09-08 DIAGNOSIS — M9904 Segmental and somatic dysfunction of sacral region: Secondary | ICD-10-CM

## 2021-09-08 DIAGNOSIS — M9901 Segmental and somatic dysfunction of cervical region: Secondary | ICD-10-CM

## 2021-09-08 NOTE — Assessment & Plan Note (Signed)
Chronic problem with mild worsening pain.  Patient does have hypermobility noted.  Do believe the patient did have more of a muscle injury though.  Discussed icing regimen and home exercises.  Discussed avoiding certain activities.  Follow-up with me again in 4 to 8 weeks.  Patient has responded well to manipulation previously and hopefully will continue to do so.

## 2021-09-08 NOTE — Patient Instructions (Signed)
Good to see you! 3 ibuprofen 3 times a day for 3 days Teach your child all the cooking See you again in 5-6 weeks

## 2021-09-21 ENCOUNTER — Encounter: Payer: Self-pay | Admitting: Family Medicine

## 2021-10-01 ENCOUNTER — Encounter: Payer: 59 | Admitting: Physical Therapy

## 2021-10-08 NOTE — Progress Notes (Signed)
[name] [address] [place] [address] Phone: [phone] Subjective:   [name], am serving as a scribe for Dr. Antoine Primas. This visit occurred during the SARS-CoV-2 public health emergency.  Safety protocols were in place, including screening questions prior to the visit, additional usage of staff PPE, and extensive cleaning of exam room while observing appropriate contact time as indicated for disinfecting solutions.   I'm seeing this patient by the request  of:  Ardith Dark, MD  CC: back and neck pain   [id number]  Maureen [name] is a 30 y.o. female coming in with complaint of back and neck pain. OMT on 09/08/2021. Patient states has been cautious of movement because of trapezius tear. No new complaints.  Patient has been doing relatively well.  Not having as much increasing the discomfort as she was having previously.  Nothing that is stopping her from activities at the moment.  Medications patient has been prescribed: None  Taking:         Reviewed prior external information including notes and imaging from previsou exam, outside providers and external EMR if available.   As well as notes that were available from care everywhere and other healthcare systems.  Past medical history, social, surgical and family history all reviewed in electronic medical record.  No pertanent information unless stated regarding to the chief complaint.   Past Medical History:  Diagnosis Date   Asthma    Bipolar 2 disorder (HCC) 2007   Bipolar 2 disorder (HCC)    Conversion disorder 2017   Conversion disorder    Depression 2007   Heavy metal poisoning    s/p chelation   Hypermobile joints    Hypoglycemia    PTSD (post-traumatic stress disorder)    Skin-picking disorder     Allergies  Allergen Reactions   Cyclobenzaprine Other (See Comments)    Psychosis   Penicillins Hives     Review of Systems:  No headache,  visual changes, nausea, vomiting, diarrhea, constipation, dizziness, abdominal pain, skin rash, fevers, chills, night sweats, weight loss, swollen lymph nodes, body aches, joint swelling, chest pain, shortness of breath, mood changes. POSITIVE muscle aches  Objective  Blood pressure 112/60, pulse 97, height 5\' 8"  (1.727 m), weight 165 lb (74.8 kg), SpO2 96 %.   General: No apparent distress alert and oriented x3 mood and affect normal, dressed appropriately.  HEENT: Pupils equal, extraocular movements intact  Respiratory: Patient's speak in full sentences and does not appear short of breath  Cardiovascular: No lower extremity edema, non tender, no erythema  Hypermobility noted in multiple joints.  Patient does have some limitation in FABER test right greater than left.  Negative straight leg test.  Osteopathic findings  C2 flexed rotated and side bent right C7 flexed rotated and side bent left T3 extended rotated and side bent right inhaled rib T9 extended rotated and side bent left L2 flexed rotated and side bent right Sacrum right on right       Assessment and Plan:  Neck pain Chronic but stable no significant exacerbation today.  Patient is doing better overall.  Discussed posture and ergonomics, discussed which activities to do which wants to avoid.  Increase activity slowly over the course the next several weeks.  Follow-up with me again in 6 to 8 weeks otherwise.    Nonallopathic problems  Decision today to treat with OMT was based on Physical Exam  After verbal consent patient was treated  with HVLA, ME, FPR techniques in cervical, rib, thoracic, lumbar, and sacral  areas  Patient tolerated the procedure well with improvement in symptoms  Patient given exercises, stretches and lifestyle modifications  See medications in patient instructions if given  Patient will follow up in 4-8 weeks      The above documentation has been reviewed and is accurate and complete  [name], DO        Note: This dictation was prepared with Dragon dictation along with smaller phrase technology. Any transcriptional errors that result from this process are unintentional.

## 2021-10-13 ENCOUNTER — Ambulatory Visit (INDEPENDENT_AMBULATORY_CARE_PROVIDER_SITE_OTHER): Payer: Self-pay | Admitting: Family Medicine

## 2021-10-13 ENCOUNTER — Other Ambulatory Visit: Payer: Self-pay

## 2021-10-13 VITALS — BP 112/60 | HR 97 | Ht 68.0 in | Wt 165.0 lb

## 2021-10-13 DIAGNOSIS — M542 Cervicalgia: Secondary | ICD-10-CM

## 2021-10-13 DIAGNOSIS — M9903 Segmental and somatic dysfunction of lumbar region: Secondary | ICD-10-CM

## 2021-10-13 DIAGNOSIS — M9902 Segmental and somatic dysfunction of thoracic region: Secondary | ICD-10-CM

## 2021-10-13 DIAGNOSIS — M9901 Segmental and somatic dysfunction of cervical region: Secondary | ICD-10-CM

## 2021-10-13 DIAGNOSIS — M9908 Segmental and somatic dysfunction of rib cage: Secondary | ICD-10-CM

## 2021-10-13 DIAGNOSIS — M9904 Segmental and somatic dysfunction of sacral region: Secondary | ICD-10-CM

## 2021-10-13 NOTE — Patient Instructions (Signed)
Great to see you  You should do well  Increase activity as tolerate See me again in 6-8 weeks Happy New Year!

## 2021-10-13 NOTE — Assessment & Plan Note (Signed)
Chronic but stable no significant exacerbation today.  Patient is doing better overall.  Discussed posture and ergonomics, discussed which activities to do which wants to avoid.  Increase activity slowly over the course the next several weeks.  Follow-up with me again in 6 to 8 weeks otherwise.

## 2021-11-17 ENCOUNTER — Ambulatory Visit: Payer: Self-pay | Attending: Obstetrics and Gynecology | Admitting: Physical Therapy

## 2021-11-17 ENCOUNTER — Encounter: Payer: Self-pay | Admitting: Physical Therapy

## 2021-11-17 ENCOUNTER — Other Ambulatory Visit: Payer: Self-pay

## 2021-11-17 DIAGNOSIS — R252 Cramp and spasm: Secondary | ICD-10-CM | POA: Insufficient documentation

## 2021-11-17 NOTE — Patient Instructions (Signed)
[email or web address]://www.youtube.com/watch?[email or web address]   

## 2021-11-17 NOTE — Therapy (Signed)
[facility] [place], [place], [address] Phone: [phone]   Fax:  [phone]  Physical Therapy Treatment  Patient Details  Name: Maureen [name] MRN: [id number] Date of Birth: Aug 10, 1991 Referring Provider (PT): Mora Bellman, MD   Encounter Date: 11/17/2021   PT End of Session - 11/17/21 1526     Visit Number 3    Date for PT Re-Evaluation 11/17/21    Authorization Type [employer]    PT Start Time 0762   pt arrival time   PT Stop Time 1527    PT Time Calculation (min) 33 min    Activity Tolerance Patient tolerated treatment well    Behavior During Therapy [facility] for tasks assessed/performed             Past Medical History:  Diagnosis Date   Asthma    Bipolar 2 disorder ([place]) 2007   Bipolar 2 disorder ([place])    Conversion disorder 2017   Conversion disorder    Depression 2007   Heavy metal poisoning    s/p chelation   Hypermobile joints    Hypoglycemia    PTSD (post-traumatic stress disorder)    Skin-picking disorder     Past Surgical History:  Procedure Laterality Date   MOUTH SURGERY N/A     There were no vitals filed for this visit.   Subjective Assessment - 11/17/21 1456     Subjective Pt reports she tore her trap muscle right before Christmas then had 3 weeks of no activity/bedrest per pt and then slowly reintroduced activity. Pt reports her pelvic pain is still present, does have pain sitting upright and felt this worsen with some of her trap HEP and thinks she wasn't doing them correctly and overusing core during these. This pelvic pain has gone away, still has a lot of pain with intercourse. Pain with intercourse with pt reporting they have attempted use of OhNut product to help with pain which it does when used however not pleasurable for pt or spouse.    Pertinent History c-section 1/22, depression, bipolar 2 disorder, conversion disorder, hypermobile joints, PTSD    How  long can you sit comfortably? no limits    How long can you stand comfortably? no limits    How long can you walk comfortably? no limits    Patient Stated Goals to have less pain with intercourse    Currently in Pain? No/denies                               [facility] Adult PT Treatment/Exercise - 11/17/21 0001       Self-Care   Self-Care Other Self-Care Comments    Other Self-Care Comments  Pt requesting any additional information or recommendations for pelvic pain with deep penetration vaginally at pelvic floor. Pt reports she has had a change in insurance and this would be her last appointment. Pt educated on pelvic relaxation, pelvic stretching HEP and diaphragmatic breathing. Pt reports she only has intercourse with deep penetration and thinks she tighten with pain or anticipating pain and has a hard time relaxing. Pt educated that based on initial evaluation she did not have tightness or pain and pt agrees and verbalized understanding however reports during intercourse pain comes from husband's sizing and unable to change this. Pt has attempted use of ohnut products with spouse and this helps. Pt given and PT went over HEP with pt  requesting stretches to attempt prior to intercourse. Pt also reports she and spouse plan to see sex therapist as well.                     PT Education - 11/17/21 1531     Education Details Pt educated on HEP, diaphragmatic breathing, and pelvic relaxation video.    Person(s) Educated Patient    Methods Explanation;Demonstration;Tactile cues;Verbal cues;Handout    Comprehension Verbalized understanding;Returned demonstration              PT Short Term Goals - 11/17/21 1613       PT SHORT TERM GOAL #1   Title pt to be I with HEP    Time 4    Period Weeks    Status Achieved    Target Date 09/11/21      PT SHORT TERM GOAL #2   Title pt report tolerance to vaginal penetration with pain no more than 5/10 with dilator size  4 in large set for improved tolerance to intercourse    Time 4    Period Weeks    Status Achieved    Target Date 09/11/21               PT Long Term Goals - 11/17/21 1613       PT LONG TERM GOAL #1   Title pt to be I with advanced HEP    Time 3    Period Months    Status Achieved    Target Date 11/14/21      PT LONG TERM GOAL #2   Title pt to tolerate dilator in large set size 4 or equivalent with no more than 2/10 pain for improved tolerance to intercourse    Time 3    Period Months    Status On-going    Target Date 11/14/21      PT LONG TERM GOAL #3   Title pt to demonstrate improved ability with breathing mechanics and pelvic floor contract/relax with activities at least 75% of the time to decrease prolapse grade    Time 3    Period Months    Status Achieved    Target Date 11/14/21                   Plan - 11/17/21 1532     Clinical Impression Statement Pt presents today reporting she had a trap tear prior to Christmas and was unable to return to pelvic floor PT until now. Pt has had continued pelvic pain only with deep penetration during intercourse. Pt denies pain other than this. Pt reports she has noticed she tightens pelvic floor with stress and thinks due to knowing deep penetration has caused pain she tightens and makes pain worse. Pt educated on lidocaine lubricant use if desired, Ohnut products to limite penetration depth during intercourse and pt reports they use this and it does relieve pain however not pleasurable for pt or spouse. Pt also reports they have only tried 4 and 3 rings not 2 and will attempt this. Pt reports due to insurance change she would like today to be last visit and understands she will need a new medical referral for future PT needs. Pt reports her and spouse are planning to see a sex therapist for additional needs as well. Pt given HEP for pelvic floor mobility and stretching as pt requested stretching to relax pelvic floor prior  to intercourse, and given pelvic floor relaxation video information. Pt denied  additional needs or questions at this time and agreeable to DC at end of this session.    Personal Factors and Comorbidities Comorbidity 1;Time since onset of injury/illness/exacerbation    Comorbidities postpartum    Examination-Activity Limitations Other   intercourse   Examination-Participation Restrictions Interpersonal Relationship    Stability/Clinical Decision Making Stable/Uncomplicated    Rehab Potential Good    PT Frequency 1x / week    PT Duration 12 weeks    PT Treatment/Interventions ADLs/Self Care Home Management;Aquatic Therapy;Functional mobility training;Therapeutic activities;Therapeutic exercise;Neuromuscular re-education;Manual techniques;Patient/family education;Taping;Scar mobilization;Passive range of motion;Energy conservation    PT Next Visit Plan go over relaxation of pelvis and breathing techniques for prolapse    Consulted and Agree with Plan of Care Patient             Patient will benefit from skilled therapeutic intervention in order to improve the following deficits and impairments:  Decreased strength, Decreased endurance  Visit Diagnosis: Cramp and spasm     Problem List Patient Active Problem List   Diagnosis Date Noted   Right shoulder pain 05/28/2021   Contraception management 01/26/2021   Neck pain 01/12/2021   Skin eruption 06/29/2020   Nonallopathic lesion of sacral region 03/05/2019   Nonallopathic lesion of lumbosacral region 03/05/2019   Nonallopathic lesion of thoracic region 03/05/2019   Hip flexor tightness, left 02/21/2019   Vitamin D deficiency 02/21/2019   Hypermobility syndrome 02/21/2019   Left hip pain 01/03/2019   Bipolar disorder ([place]) 01/03/2019   PTSD (post-traumatic stress disorder) 01/03/2019   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    Remaining deficits: Pain with deep vaginal intercourse without use of lubricant or depth assistance products.    Education / Equipment: HEP   Patient agrees to discharge. Patient goals were partially met. Patient is being discharged due to financial reasons and lack of progress outside of with use of additional products such a lubricants or OhNut rings. Pt reports these improve pain levels well but does not wish to use them.    [facility] [address] [place], [place], [address] Phone: [phone]   Fax:  [phone]  Name: Maureen [name] MRN: [id number] Date of Birth: 1991/10/08

## 2021-12-03 IMAGING — DX DG CERVICAL SPINE 2 OR 3 VIEWS
3 series · 3 of 3 positions shown · non-contrast
Comparison: None.

CLINICAL DATA: Cervical neck pain. Posterior pain for 1 month,
progressive. Limited range of motion.

EXAM:
CERVICAL SPINE - 2-3 VIEW

[c-spine lat]
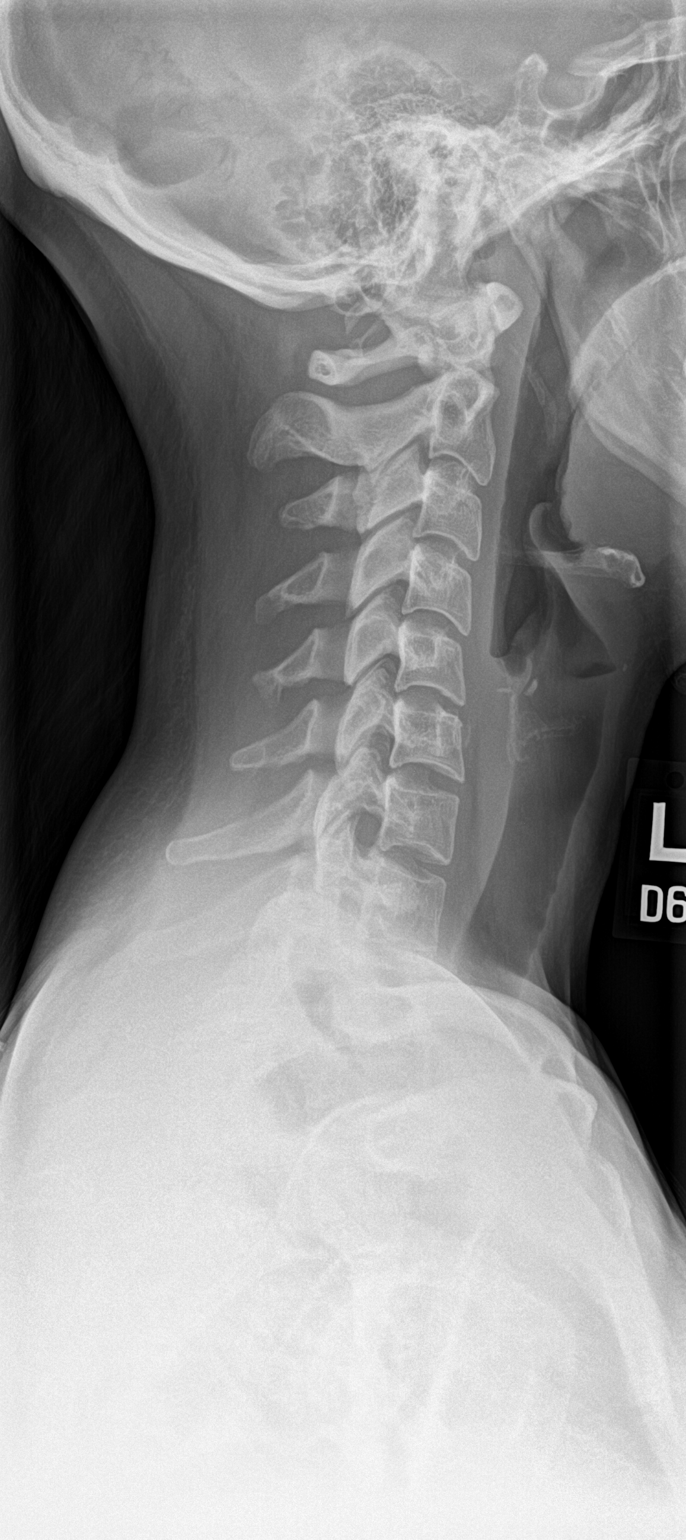

[c-spine ap]
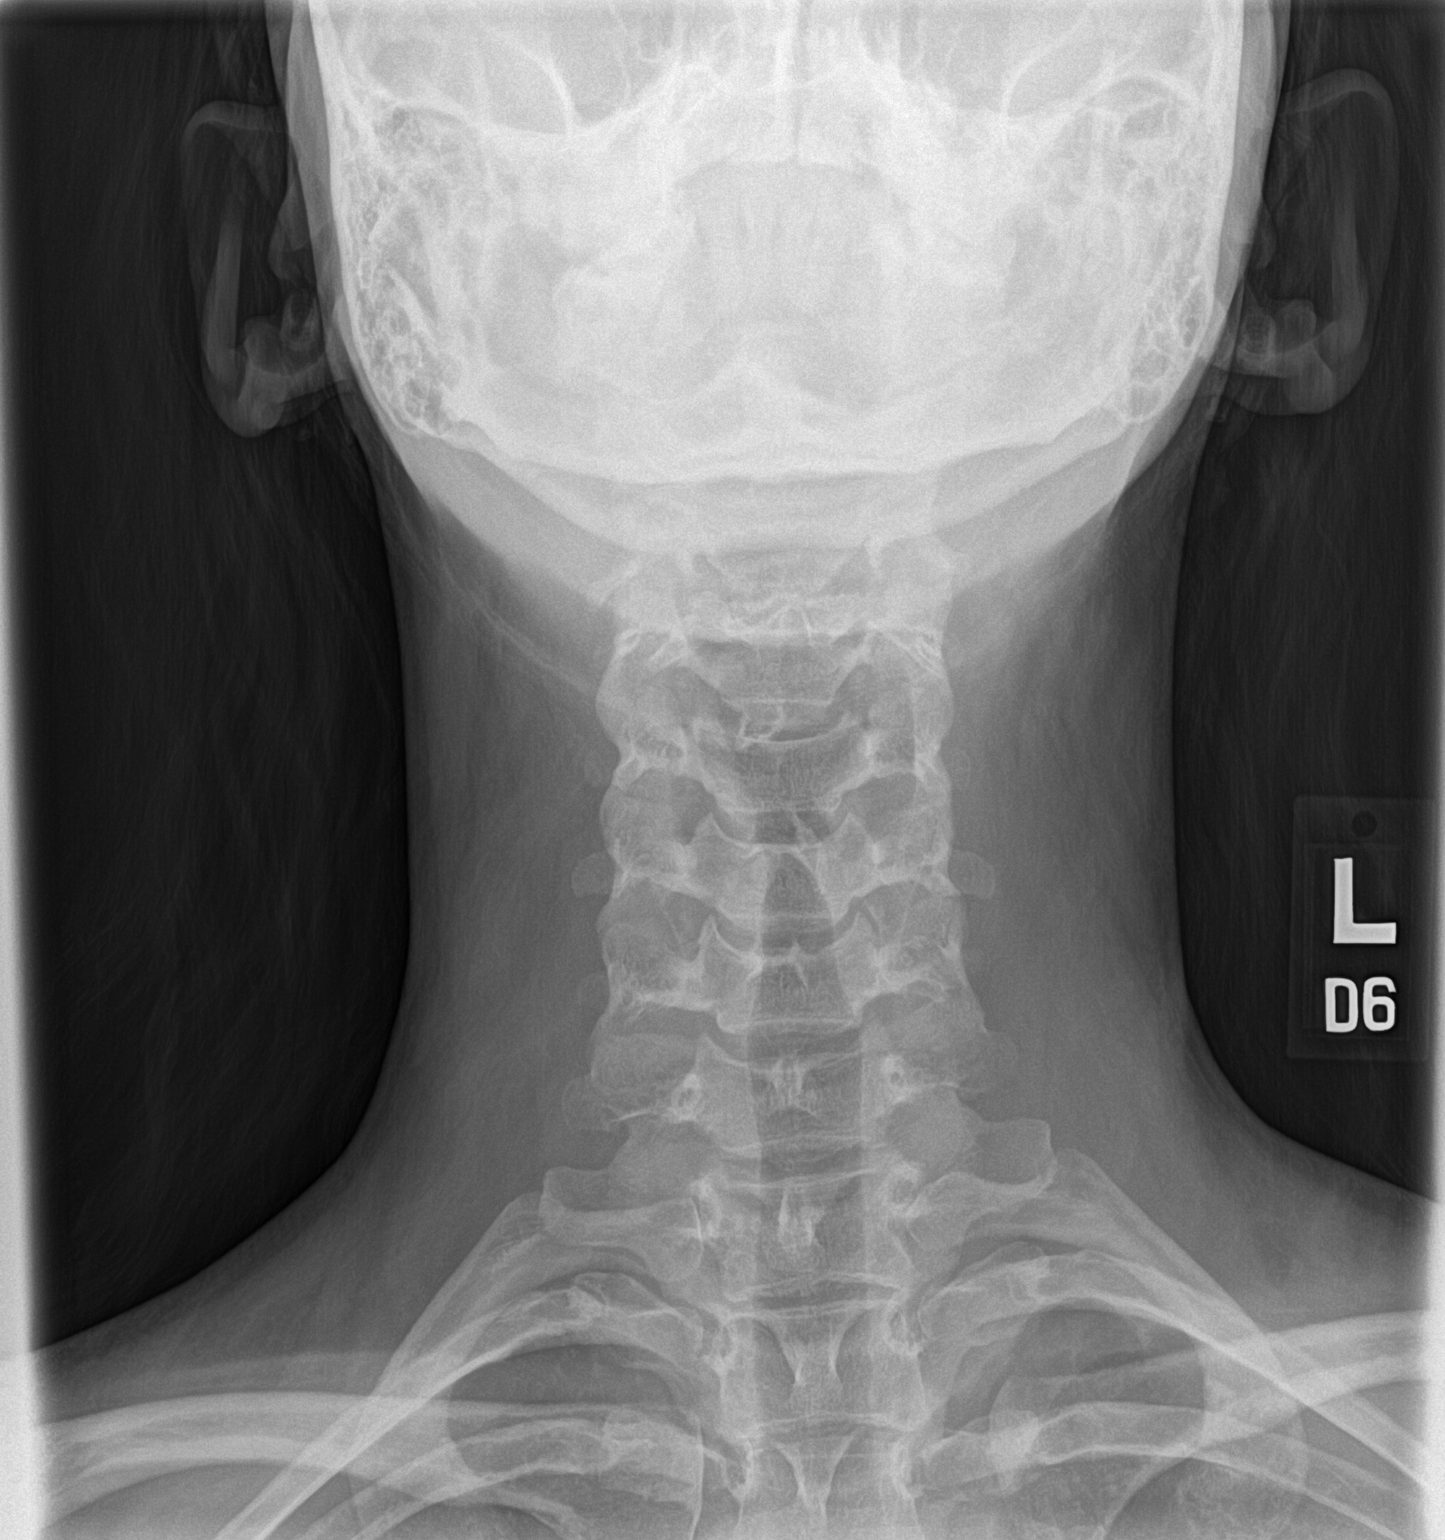

[c-spine open mouth]
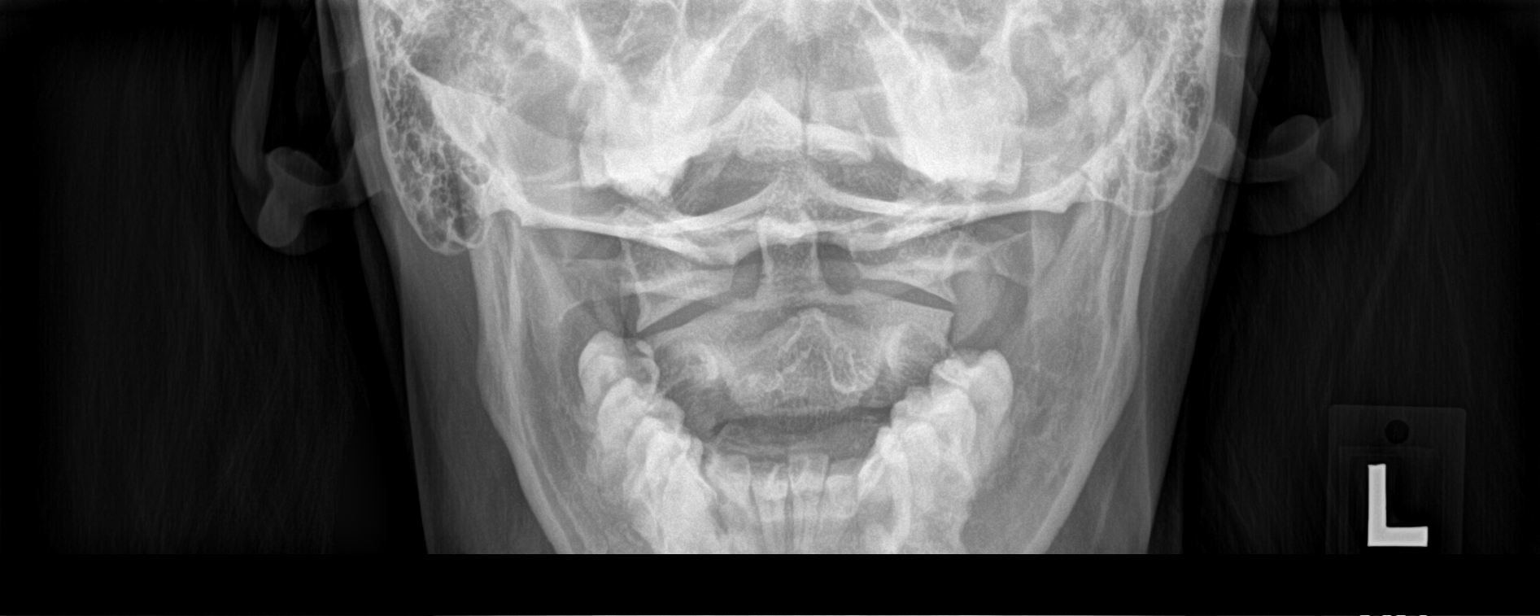

[3 of 3 positions shown; findings below may reference images not displayed]

FINDINGS: Cervical spine alignment is maintained. Vertebral body heights and
intervertebral disc spaces are preserved. The dens is intact.
Posterior elements appear well-aligned. There is no evidence of
fracture, focal lesion or bone destruction. No prevertebral soft
tissue edema.
IMPRESSION: Negative radiographs of the cervical spine.

## 2021-12-07 NOTE — Progress Notes (Signed)
[facility] [place] Phone: [phone] Subjective:   Maureen [name], am serving as a scribe for Dr. Hulan Saas.  This visit occurred during the SARS-CoV-2 public health emergency.  Safety protocols were in place, including screening questions prior to the visit, additional usage of staff PPE, and extensive cleaning of exam room while observing appropriate contact time as indicated for disinfecting solutions.   I'm seeing this patient by the request  of:  Vivi Barrack, MD  CC: back and neck pain   [id number]  Maureen [name] is a 31 y.o. female coming in with complaint of back and neck pain. OMT 10/13/2021. Patient states that she has been doing ok. Feels like she need OMT every 6-8 weeks.  Has had some more stress recently as well.  Medications patient has been prescribed: None  Taking:         Reviewed prior external information including notes and imaging from previsou exam, outside providers and external EMR if available.   As well as notes that were available from care everywhere and other healthcare systems.  Past medical history, social, surgical and family history all reviewed in electronic medical record.  [name] pertanent information unless stated regarding to the chief complaint.   Past Medical History:  Diagnosis Date   Asthma    Bipolar 2 disorder ([place]) 2007   Bipolar 2 disorder ([place])    Conversion disorder 2017   Conversion disorder    Depression 2007   Heavy metal poisoning    s/p chelation   Hypermobile joints    Hypoglycemia    PTSD (post-traumatic stress disorder)    Skin-picking disorder     Allergies  Allergen Reactions   Cyclobenzaprine Other (See Comments)    Psychosis   Penicillins Hives     Review of Systems:  [name] headache, visual changes, nausea, vomiting, diarrhea, constipation, dizziness, abdominal pain, skin rash, fevers, chills, night sweats, weight loss, swollen  lymph nodes, body aches, joint swelling, chest pain, shortness of breath, mood changes. POSITIVE muscle aches  Objective  Blood pressure 122/82, pulse 81, height 5\' 8"  (1.727 m), weight 160 lb (72.6 kg), SpO2 98 %.   General: [name] apparent distress alert and oriented x3 mood and affect normal, dressed appropriately.  HEENT: Pupils equal, extraocular movements intact  Respiratory: Patient's speak in full sentences and does not appear short of breath  Cardiovascular: [name] lower extremity edema, non tender, [name] erythema  Neck exam does have some loss of lordosis.  Some tenderness to palpation in the paraspinal musculature as well noted.  Osteopathic findings  C2 flexed rotated and side bent right C6 flexed rotated and side bent left T3 extended rotated and side bent right inhaled rib T9 extended rotated and side bent left L2 flexed rotated and side bent right Sacrum right on right       Assessment and Plan:  Neck pain Neck pain and does have the hypermobility noted.  Patient is trying to be more active.  Does have tightness noted.  Discussed which activities to do and which ones to avoid.  Increase activity slowly.  Follow-up again in 6 to 8 weeks    Nonallopathic problems  Decision today to treat with OMT was based on Physical Exam  After verbal consent patient was treated with HVLA, ME, FPR techniques in cervical, rib, thoracic, lumbar, and sacral  areas  Patient tolerated the procedure well with improvement in symptoms  Patient given exercises,  stretches and lifestyle modifications  See medications in patient instructions if given  Patient will follow up in 4-8 weeks      The above documentation has been reviewed and is accurate and complete [name], DO        Note: This dictation was prepared with Dragon dictation along with smaller phrase technology. Any transcriptional errors that result from this process are unintentional.

## 2021-12-08 ENCOUNTER — Ambulatory Visit (INDEPENDENT_AMBULATORY_CARE_PROVIDER_SITE_OTHER): Payer: Self-pay | Admitting: Family Medicine

## 2021-12-08 ENCOUNTER — Other Ambulatory Visit: Payer: Self-pay

## 2021-12-08 VITALS — BP 122/82 | HR 81 | Ht 68.0 in | Wt 160.0 lb

## 2021-12-08 DIAGNOSIS — M9903 Segmental and somatic dysfunction of lumbar region: Secondary | ICD-10-CM

## 2021-12-08 DIAGNOSIS — M9901 Segmental and somatic dysfunction of cervical region: Secondary | ICD-10-CM

## 2021-12-08 DIAGNOSIS — M9904 Segmental and somatic dysfunction of sacral region: Secondary | ICD-10-CM

## 2021-12-08 DIAGNOSIS — M9902 Segmental and somatic dysfunction of thoracic region: Secondary | ICD-10-CM

## 2021-12-08 DIAGNOSIS — M542 Cervicalgia: Secondary | ICD-10-CM

## 2021-12-08 DIAGNOSIS — M9908 Segmental and somatic dysfunction of rib cage: Secondary | ICD-10-CM

## 2021-12-08 NOTE — Assessment & Plan Note (Signed)
Neck pain and does have the hypermobility noted.  Patient is trying to be more active.  Does have tightness noted.  Discussed which activities to do and which ones to avoid.  Increase activity slowly.  Follow-up again in 6 to 8 weeks

## 2021-12-08 NOTE — Patient Instructions (Signed)
Good to see you! Lets not change anything up See you again in 5-8 weeks

## 2021-12-10 ENCOUNTER — Other Ambulatory Visit (HOSPITAL_COMMUNITY)
Admission: RE | Admit: 2021-12-10 | Discharge: 2021-12-10 | Disposition: A | Discharge status: Home / Self Care | Payer: Self-pay | Source: Ambulatory Visit | Attending: Family Medicine | Admitting: Family Medicine

## 2021-12-10 ENCOUNTER — Ambulatory Visit (INDEPENDENT_AMBULATORY_CARE_PROVIDER_SITE_OTHER): Payer: Self-pay

## 2021-12-10 ENCOUNTER — Other Ambulatory Visit: Payer: Self-pay

## 2021-12-10 VITALS — BP 110/80 | HR 98 | Wt 160.6 lb

## 2021-12-10 DIAGNOSIS — Z113 Encounter for screening for infections with a predominantly sexual mode of transmission: Secondary | ICD-10-CM | POA: Insufficient documentation

## 2021-12-10 DIAGNOSIS — A64 Unspecified sexually transmitted disease: Secondary | ICD-10-CM

## 2021-12-10 NOTE — Progress Notes (Signed)
Pt here today for STD testing. Pt denies any vaginal bleeding, discharge or odor at this time. Self swab collected today. Pt advised results will take 24-48 hours and will see results in mychart and will be notified if needs further treatment. Pt verbalized understanding.  ?Malek Skog,RN  ?

## 2021-12-11 LAB — CERVICOVAGINAL ANCILLARY ONLY
Bacterial Vaginitis (gardnerella): POSITIVE — AB
Candida Glabrata: NEGATIVE
Candida Vaginitis: NEGATIVE
Chlamydia: NEGATIVE
Comment: NEGATIVE
Comment: NEGATIVE
Comment: NEGATIVE
Comment: NEGATIVE
Comment: NEGATIVE
Comment: NORMAL
Neisseria Gonorrhea: NEGATIVE
Trichomonas: NEGATIVE

## 2021-12-11 MED ORDER — METRONIDAZOLE 500 MG PO TABS: 500.0000 mg | ORAL_TABLET | Freq: Two times a day (BID) | ORAL | 0 refills | Status: DC

## 2021-12-11 NOTE — Addendum Note (Signed)
Addended by: Reva Bores on: 12/11/2021 02:44 PM ? ? Modules accepted: Orders ? ?

## 2022-01-19 NOTE — Progress Notes (Signed)
?Maureen [name] D.O. ?[facility] ?[address] [place] [address] ?Phone: [phone] ?Subjective:   ?I, Wilford Grist, am serving as a scribe for Dr. Antoine Primas. ? ?This visit occurred during the SARS-CoV-2 public health emergency.  Safety protocols were in place, including screening questions prior to the visit, additional usage of staff PPE, and extensive cleaning of exam room while observing appropriate contact time as indicated for disinfecting solutions.  ? ? ?I'm seeing this patient by the request  of:  Ardith Dark, MD ? ?CC: Back and neck pain follow-up ? ?[id number]  ?Maureen [name] is a 31 y.o. female coming in with complaint of back and neck pain. OMT 12/08/2021. Patient states that she has been having neck and lower back pain after  ? ?Medications patient has been prescribed: None ? ? ? ?  ? ? ? ? ?Reviewed prior external information including notes and imaging from previsou exam, outside providers and external EMR if available.  ? ?As well as notes that were available from care everywhere and other healthcare systems. ? ?Past medical history, social, surgical and family history all reviewed in electronic medical record.  No pertanent information unless stated regarding to the chief complaint.  ? ?Past Medical History:  ?Diagnosis Date  ? Asthma   ? Bipolar 2 disorder (HCC) 2007  ? Bipolar 2 disorder (HCC)   ? Conversion disorder 2017  ? Conversion disorder   ? Depression 2007  ? Heavy metal poisoning   ? s/p chelation  ? Hypermobile joints   ? Hypoglycemia   ? PTSD (post-traumatic stress disorder)   ? Skin-picking disorder   ?  ?Allergies  ?Allergen Reactions  ? Cyclobenzaprine Other (See Comments)  ?  Psychosis  ? Penicillins Hives  ? ? ? ?Review of Systems: ? No headache, visual changes, nausea, vomiting, diarrhea, constipation, dizziness, abdominal pain, skin rash, fevers, chills, night sweats, weight loss, swollen lymph nodes, body aches, joint swelling, chest pain,  shortness of breath, mood changes. POSITIVE muscle aches ? ?Objective  ?Blood pressure 122/78, pulse 82, height 5\' 8"  (1.727 m), weight 161 lb (73 kg), SpO2 98 %. ?  ?General: No apparent distress alert and oriented x3 mood and affect normal, dressed appropriately.  ?HEENT: Pupils equal, extraocular movements intact  ?Respiratory: Patient's speak in full sentences and does not appear short of breath  ?Cardiovascular: No lower extremity edema, non tender, no erythema  ?Gait normal with good balance and coordination.  ?MSK:  Non tender with full range of motion and good stability and symmetric strength and tone of shoulders, elbows, wrist, hip, knee and ankles bilaterally.  ?Neck exam does have some loss of lordosis.  Tightness noted in the parascapular region right greater than left.  Very mild scapular dyskinesis potentially noted. ? ?Osteopathic findings ? ?C3 flexed rotated and side bent right ?C6 flexed rotated and side bent left ?T3 extended rotated and side bent right inhaled rib ?T5 extended rotated and side bent left ?L3 flexed rotated and side bent left ?Sacral right on right ? ? ? ? ?  ?Assessment and Plan: ? ?Hip flexor tightness, left ?Mild increase in tightness noted today.  Discussed icing regimen and home exercise, which activities to do which ones to avoid, increase activity slowly.  Discussed icing regimen and home exercises.  Follow-up with me again in in 6 to 8 weeks. ?  ? ?Nonallopathic problems ? ?Decision today to treat with OMT was based on Physical Exam ? ?After verbal consent patient  was treated with HVLA, ME, FPR techniques in cervical, rib, thoracic, lumbar, sacrum areas ? ?Patient tolerated the procedure well with improvement in symptoms ? ?Patient given exercises, stretches and lifestyle modifications ? ?See medications in patient instructions if given ? ?Patient will follow up in 4-8 weeks ? ?  ? ? ?The above documentation has been reviewed and is accurate and complete Judi Saa,  DO ? ? ? ?  ? ? Note: This dictation was prepared with Dragon dictation along with smaller phrase technology. Any transcriptional errors that result from this process are unintentional.    ?  ?  ? ?

## 2022-01-20 ENCOUNTER — Encounter: Payer: Self-pay | Admitting: Family Medicine

## 2022-01-20 ENCOUNTER — Ambulatory Visit (INDEPENDENT_AMBULATORY_CARE_PROVIDER_SITE_OTHER): Payer: Self-pay | Admitting: Family Medicine

## 2022-01-20 VITALS — BP 122/78 | HR 82 | Ht 68.0 in | Wt 161.0 lb

## 2022-01-20 DIAGNOSIS — M9908 Segmental and somatic dysfunction of rib cage: Secondary | ICD-10-CM

## 2022-01-20 DIAGNOSIS — M9902 Segmental and somatic dysfunction of thoracic region: Secondary | ICD-10-CM

## 2022-01-20 DIAGNOSIS — M9903 Segmental and somatic dysfunction of lumbar region: Secondary | ICD-10-CM

## 2022-01-20 DIAGNOSIS — M9901 Segmental and somatic dysfunction of cervical region: Secondary | ICD-10-CM

## 2022-01-20 DIAGNOSIS — M24552 Contracture, left hip: Secondary | ICD-10-CM

## 2022-01-20 DIAGNOSIS — M9904 Segmental and somatic dysfunction of sacral region: Secondary | ICD-10-CM

## 2022-01-20 NOTE — Assessment & Plan Note (Signed)
Mild increase in tightness noted today.  Discussed icing regimen and home exercise, which activities to do which ones to avoid, increase activity slowly.  Discussed icing regimen and home exercises.  Follow-up with me again in in 6 to 8 weeks. ?

## 2022-01-20 NOTE — Patient Instructions (Addendum)
Good to see you ?Stay active ?Take time for yourself ?See me in 6-8 weeks ?

## 2022-03-02 NOTE — Progress Notes (Unsigned)
[name] [address] [place] [address] Phone: [phone] Subjective:   [name], am serving as a scribe for Dr. Antoine Primas.  I'm seeing this patient by the request  of:  Ardith Dark, MD  CC: Back and neck pain follow-up  [id number]  [name] is a 31 y.o. female coming in with complaint of back and neck pain. OMT on 01/20/2022. Patient states same per usual. Driven for the first time in a few weeks. Neck back and hands bothersome. No new complaints  Medications patient has been prescribed: None  Taking:         Reviewed prior external information including notes and imaging from previsou exam, outside providers and external EMR if available.   As well as notes that were available from care everywhere and other healthcare systems.  Past medical history, social, surgical and family history all reviewed in electronic medical record.  No pertanent information unless stated regarding to the chief complaint.   Past Medical History:  Diagnosis Date   Asthma    Bipolar 2 disorder (HCC) 2007   Bipolar 2 disorder (HCC)    Conversion disorder 2017   Conversion disorder    Depression 2007   Heavy metal poisoning    s/p chelation   Hypermobile joints    Hypoglycemia    PTSD (post-traumatic stress disorder)    Skin-picking disorder     Allergies  Allergen Reactions   Cyclobenzaprine Other (See Comments)    Psychosis   Penicillins Hives     Review of Systems:  No headache, visual changes, nausea, vomiting, diarrhea, constipation, dizziness, abdominal pain, skin rash, fevers, chills, night sweats, weight loss, swollen lymph nodes, body aches, joint swelling, chest pain, shortness of breath, mood changes. POSITIVE muscle aches  Objective  Blood pressure 116/80, pulse 90, height 5\' 8"  (1.727 m), weight 158 lb (71.7 kg), SpO2 99 %.   General: No apparent distress alert and oriented x3 mood and affect  normal, dressed appropriately.  HEENT: Pupils equal, extraocular movements intact  Respiratory: Patient's speak in full sentences and does not appear short of breath  Cardiovascular: No lower extremity edema, non tender, no erythema  Neck exam does have some loss of lordosis.  Some tenderness to palpation in the parascapular regions bilaterally.  Tightness over the right sacroiliac joint noted.  Left hip is more tender to palpation of the greater trochanteric area.  Osteopathic findings  C2 flexed rotated and side bent right C6 flexed rotated and side bent left T3 extended rotated and side bent right inhaled rib T9 extended rotated and side bent left L2 flexed rotated and side bent right Sacrum left on left       Assessment and Plan:  Hypermobility syndrome Continues to have hypermobility.  Discussed with patient to continue to work on muscle strengthening.  Has responded extremely well though to osteopathic manipulation.  Discussed which activities to do which ones to avoid, increase activity slowly.  Follow-up again in 6 to 8 weeks.   Nonallopathic problems  Decision today to treat with OMT was based on Physical Exam  After verbal consent patient was treated with HVLA, ME, FPR techniques in cervical, rib, thoracic, lumbar, and sacral  areas  Patient tolerated the procedure well with improvement in symptoms  Patient given exercises, stretches and lifestyle modifications  See medications in patient instructions if given  Patient will follow up in 4-8 weeks      The above documentation  has been reviewed and is accurate and complete [name], DO        Note: This dictation was prepared with Dragon dictation along with smaller phrase technology. Any transcriptional errors that result from this process are unintentional.

## 2022-03-03 ENCOUNTER — Ambulatory Visit (INDEPENDENT_AMBULATORY_CARE_PROVIDER_SITE_OTHER): Payer: 59 | Admitting: Family Medicine

## 2022-03-03 VITALS — BP 116/80 | HR 90 | Ht 68.0 in | Wt 158.0 lb

## 2022-03-03 DIAGNOSIS — M9903 Segmental and somatic dysfunction of lumbar region: Secondary | ICD-10-CM

## 2022-03-03 DIAGNOSIS — M9904 Segmental and somatic dysfunction of sacral region: Secondary | ICD-10-CM | POA: Diagnosis not present

## 2022-03-03 DIAGNOSIS — M9908 Segmental and somatic dysfunction of rib cage: Secondary | ICD-10-CM

## 2022-03-03 DIAGNOSIS — M9902 Segmental and somatic dysfunction of thoracic region: Secondary | ICD-10-CM

## 2022-03-03 DIAGNOSIS — M9901 Segmental and somatic dysfunction of cervical region: Secondary | ICD-10-CM | POA: Diagnosis not present

## 2022-03-03 DIAGNOSIS — M357 Hypermobility syndrome: Secondary | ICD-10-CM | POA: Diagnosis not present

## 2022-03-03 NOTE — Patient Instructions (Signed)
See me in 6-7 weeks Good luck with house shopping

## 2022-03-03 NOTE — Assessment & Plan Note (Signed)
Continues to have hypermobility.  Discussed with patient to continue to work on muscle strengthening.  Has responded extremely well though to osteopathic manipulation.  Discussed which activities to do which ones to avoid, increase activity slowly.  Follow-up again in 6 to 8 weeks.

## 2022-04-06 ENCOUNTER — Ambulatory Visit (INDEPENDENT_AMBULATORY_CARE_PROVIDER_SITE_OTHER): Payer: 59 | Admitting: *Deleted

## 2022-04-06 ENCOUNTER — Other Ambulatory Visit (HOSPITAL_COMMUNITY)
Admission: RE | Admit: 2022-04-06 | Discharge: 2022-04-06 | Disposition: A | Discharge status: Home / Self Care | Payer: 59 | Source: Ambulatory Visit | Attending: Family Medicine | Admitting: Family Medicine

## 2022-04-06 VITALS — BP 116/68 | HR 99 | Ht 68.0 in | Wt 158.8 lb

## 2022-04-06 DIAGNOSIS — N898 Other specified noninflammatory disorders of vagina: Secondary | ICD-10-CM

## 2022-04-06 DIAGNOSIS — R319 Hematuria, unspecified: Secondary | ICD-10-CM

## 2022-04-06 DIAGNOSIS — R102 Pelvic and perineal pain: Secondary | ICD-10-CM

## 2022-04-06 LAB — POCT URINALYSIS DIP (DEVICE)
Bilirubin Urine: NEGATIVE
Glucose, UA: NEGATIVE mg/dL
Ketones, ur: NEGATIVE mg/dL
Leukocytes,Ua: NEGATIVE
Nitrite: NEGATIVE
Protein, ur: NEGATIVE mg/dL
Specific Gravity, Urine: <=1.005 (ref 1.005–1.030)
Urobilinogen, UA: 0.2 mg/dL (ref 0.0–1.0)
pH: 5.5 (ref 5.0–8.0)

## 2022-04-06 NOTE — Progress Notes (Signed)
Here for nurse visit for self swab and possible uti. C/o random RLQ pain,, severe at times  and vaginal stinging  randomly for a week or so. Denies  vaginal itching. C/o powdery white vaginal discharge. Denies pain with urination, burning, urgency or frequency. Will collect urine for urinalysis and self swab to rule out infections.  Advised to schedule gyn visit for pelvic pain and cancel if not needed. Advised will be notified if anything + and needs treatment. Urinalysis showed trace blood, sent for culture. Advised if she has severe pain to go to hospital for evaluation. She voices understanding. Nancy Fetter

## 2022-04-07 LAB — CERVICOVAGINAL ANCILLARY ONLY
Bacterial Vaginitis (gardnerella): POSITIVE — AB
Candida Glabrata: NEGATIVE
Candida Vaginitis: NEGATIVE
Chlamydia: NEGATIVE
Comment: NEGATIVE
Comment: NEGATIVE
Comment: NEGATIVE
Comment: NEGATIVE
Comment: NEGATIVE
Comment: NORMAL
Neisseria Gonorrhea: NEGATIVE
Trichomonas: NEGATIVE

## 2022-04-08 ENCOUNTER — Other Ambulatory Visit: Payer: Self-pay | Admitting: Family Medicine

## 2022-04-08 LAB — URINE CULTURE

## 2022-04-08 MED ORDER — METRONIDAZOLE 500 MG PO TABS: 500.0000 mg | ORAL_TABLET | Freq: Two times a day (BID) | ORAL | 0 refills | Status: DC

## 2022-04-15 NOTE — Progress Notes (Signed)
  [name] [address] [place] [address] Phone: [phone] Subjective:   Bruce Donath, am serving as a scribe for Dr. Antoine Primas.   I'm seeing this patient by the request  of:  Ardith Dark, MD  CC: Back and neck pain follow-up  [id number]  Maureen [name] is a 31 y.o. female coming in with complaint of back and neck pain. OMT on 03/03/2022. Patient states that she is doing much better.  Patient has been going on progesterone therapy and thinks it has been beneficial.  Medications patient has been prescribed: None  Taking:       Past Medical History:  Diagnosis Date   Asthma    Bipolar 2 disorder (HCC) 2007   Bipolar 2 disorder (HCC)    Conversion disorder 2017   Conversion disorder    Depression 2007   Heavy metal poisoning    s/p chelation   Hypermobile joints    Hypoglycemia    PTSD (post-traumatic stress disorder)    Skin-picking disorder     Allergies  Allergen Reactions   Cyclobenzaprine Other (See Comments)    Psychosis   Penicillins Hives     Review of Systems:  No headache, visual changes, nausea, vomiting, diarrhea, constipation, dizziness, abdominal pain, skin rash, fevers, chills, night sweats, weight loss, swollen lymph nodes, body aches, joint swelling, chest pain, shortness of breath, mood changes. POSITIVE muscle aches  Objective  Blood pressure 110/80, pulse 96, height 5\' 8"  (1.727 m), weight 154 lb (69.9 kg), SpO2 97 %.   General: No apparent distress alert and oriented x3 mood and affect normal, dressed appropriately.  HEENT: Pupils equal, extraocular movements intact  Respiratory: Patient's speak in full sentences and does not appear short of breath  Cardiovascular: No lower extremity edema, non tender, no erythema  MSK:  Back does have some loss of lordosis.  Patient does have some tenderness to palpation in the paraspinal musculature.  Hypomobility still noted.  Seems to be more  tender also in the parascapular region right greater than left  Osteopathic findings  C2 flexed rotated and side bent right C7 flexed rotated and side bent right T3 extended rotated and side bent right inhaled rib T9 extended rotated and side bent left L2 flexed rotated and side bent right Sacrum right on right       Assessment and Plan:  Neck pain Neck stable overall still hypermobility  Responding well to OMT  May be moving.  RTC in 6 weeks     Nonallopathic problems  Decision today to treat with OMT was based on Physical Exam  After verbal consent patient was treated with HVLA, ME, FPR techniques in cervical, rib, thoracic, lumbar, and sacral  areas  Patient tolerated the procedure well with improvement in symptoms  Patient given exercises, stretches and lifestyle modifications  See medications in patient instructions if given  Patient will follow up in 4-8 weeks     The above documentation has been reviewed and is accurate and complete , DO         Note: This dictation was prepared with Dragon dictation along with smaller phrase technology. Any transcriptional errors that result from this process are unintentional.

## 2022-04-21 ENCOUNTER — Encounter: Payer: Self-pay | Admitting: Family Medicine

## 2022-04-21 ENCOUNTER — Ambulatory Visit (INDEPENDENT_AMBULATORY_CARE_PROVIDER_SITE_OTHER): Payer: Self-pay | Admitting: Family Medicine

## 2022-04-21 DIAGNOSIS — M542 Cervicalgia: Secondary | ICD-10-CM

## 2022-04-21 NOTE — Assessment & Plan Note (Signed)
Neck stable overall still hypermobility  Responding well to OMT  May be moving.  RTC in 6 weeks

## 2022-04-21 NOTE — Patient Instructions (Signed)
Good to see you See me again in 7-8 weeks 

## 2022-07-05 ENCOUNTER — Encounter: Payer: Self-pay | Admitting: *Deleted

## 2022-07-08 NOTE — Progress Notes (Signed)
[place] Phone: [phone] Subjective:   Maureen [name], am serving as a scribe for Dr. Hulan Saas.  I'm seeing this patient by the request  of:  Vivi Barrack, MD  CC: Low back pain  [id number]  Maureen [name] is a 31 y.o. female coming in with complaint of back and neck pain. OMT on 04/21/2022. Patient states that she has been doing well. [name] longer using birth control with progesterone and finds that she is [name] longer having debilitating pain.   Medications patient has been prescribed:   Taking:         Reviewed prior external information including notes and imaging from previsou exam, outside providers and external EMR if available.   As well as notes that were available from care everywhere and other healthcare systems.  Past medical history, social, surgical and family history all reviewed in electronic medical record.  [name] pertanent information unless stated regarding to the chief complaint.   Past Medical History:  Diagnosis Date   Asthma    Bipolar 2 disorder ([place]) 2007   Bipolar 2 disorder ([place])    Conversion disorder 2017   Conversion disorder    Depression 2007   Heavy metal poisoning    s/p chelation   Hypermobile joints    Hypoglycemia    PTSD (post-traumatic stress disorder)    Skin-picking disorder     Allergies  Allergen Reactions   Cyclobenzaprine Other (See Comments)    Psychosis   Penicillins Hives     Review of Systems:  [name] headache, visual changes, nausea, vomiting, diarrhea, constipation, dizziness, abdominal pain, skin rash, fevers, chills, night sweats, weight loss, swollen lymph nodes, body aches, joint swelling, chest pain, shortness of breath, mood changes. POSITIVE muscle aches  Objective  Blood pressure 110/80, pulse 88, height 5\' 8"  (1.727 m), weight 159 lb (72.1 kg), SpO2 98 %.   General: [name] apparent distress alert and oriented x3 mood and  affect normal, dressed appropriately.  HEENT: Pupils equal, extraocular movements intact  Respiratory: Patient's speak in full sentences and does not appear short of breath  Cardiovascular: [name] lower extremity edema, non tender, [name] erythema  Back exam does have some loss of lordosis.  Some tenderness to palpation in the paraspinal musculature.  Hypomobility noted  Osteopathic findings  C2 flexed rotated and side bent right C7 flexed rotated and side bent left T3 extended rotated and side bent right inhaled rib T9 extended rotated and side bent left L3 flexed rotated and side bent left Sacrum right on right       Assessment and Plan:  Neck pain Overall patient has made quite an improvement overall.  We discussed icing regimen and home exercises, which activities to do and which ones to avoid, increase activity slowly over the course of the next several weeks.  Follow-up with me again in 6 to 8 weeks.    Nonallopathic problems  Decision today to treat with OMT was based on Physical Exam  After verbal consent patient was treated with HVLA, ME, FPR techniques in cervical, rib, thoracic, lumbar, and sacral  areas  Patient tolerated the procedure well with improvement in symptoms  Patient given exercises, stretches and lifestyle modifications  See medications in patient instructions if given  Patient will follow up in 4-8 weeks    The above documentation has been reviewed and is accurate and complete [name], DO  Note: This dictation was prepared with Dragon dictation along with smaller phrase technology. Any transcriptional errors that result from this process are unintentional.

## 2022-07-13 ENCOUNTER — Encounter: Payer: Self-pay | Admitting: Family Medicine

## 2022-07-13 ENCOUNTER — Ambulatory Visit (INDEPENDENT_AMBULATORY_CARE_PROVIDER_SITE_OTHER): Payer: No Typology Code available for payment source | Admitting: Family Medicine

## 2022-07-13 VITALS — BP 110/80 | HR 88 | Ht 68.0 in | Wt 159.0 lb

## 2022-07-13 DIAGNOSIS — M9908 Segmental and somatic dysfunction of rib cage: Secondary | ICD-10-CM | POA: Diagnosis not present

## 2022-07-13 DIAGNOSIS — M542 Cervicalgia: Secondary | ICD-10-CM | POA: Diagnosis not present

## 2022-07-13 DIAGNOSIS — M357 Hypermobility syndrome: Secondary | ICD-10-CM

## 2022-07-13 DIAGNOSIS — M9903 Segmental and somatic dysfunction of lumbar region: Secondary | ICD-10-CM | POA: Diagnosis not present

## 2022-07-13 DIAGNOSIS — M9904 Segmental and somatic dysfunction of sacral region: Secondary | ICD-10-CM

## 2022-07-13 DIAGNOSIS — M9902 Segmental and somatic dysfunction of thoracic region: Secondary | ICD-10-CM

## 2022-07-13 DIAGNOSIS — M9901 Segmental and somatic dysfunction of cervical region: Secondary | ICD-10-CM | POA: Diagnosis not present

## 2022-07-13 NOTE — Assessment & Plan Note (Signed)
We will continue to monitor the hypermobility.  Patient is concerned about her son who has had some health issues as well.  Could consider the possibility of genetic testing if needed.

## 2022-07-13 NOTE — Assessment & Plan Note (Signed)
Overall patient has made quite an improvement overall.  We discussed icing regimen and home exercises, which activities to do and which ones to avoid, increase activity slowly over the course of the next several weeks.  Follow-up with me again in 6 to 8 weeks.

## 2022-07-13 NOTE — Patient Instructions (Signed)
See me again in 3 months.

## 2022-07-20 ENCOUNTER — Other Ambulatory Visit: Payer: Self-pay

## 2022-07-20 ENCOUNTER — Ambulatory Visit (INDEPENDENT_AMBULATORY_CARE_PROVIDER_SITE_OTHER): Payer: No Typology Code available for payment source

## 2022-07-20 ENCOUNTER — Other Ambulatory Visit (HOSPITAL_COMMUNITY)
Admission: RE | Admit: 2022-07-20 | Discharge: 2022-07-20 | Disposition: A | Discharge status: Home / Self Care | Payer: No Typology Code available for payment source | Source: Ambulatory Visit | Attending: Family Medicine | Admitting: Family Medicine

## 2022-07-20 VITALS — BP 118/80 | HR 90 | Wt 157.8 lb

## 2022-07-20 DIAGNOSIS — N898 Other specified noninflammatory disorders of vagina: Secondary | ICD-10-CM

## 2022-07-20 NOTE — Progress Notes (Signed)
Here today with complaint of vaginal itching. Pt reports two recent occurences of BV, during which time she felt stinging in her vagina. Pt is unsure if she is experiencing BV currently because she does not feel stinging sensation. Denies any vaginal odor, abnormal vaginal discharge, or vaginal dryness. Vaginal self swab collected by patient. Explained we will contact her with any abnormal results. Discussed presence of healthy bacteria in vagina, vaginal hygiene, and non medication treatment options for yeast/BV.  Offered annual visit with provider to discuss recurrent infections, pt declines due to not having insurance coverage. Offered visit with [facility] to update Pap; last done 04/09/22 with normal result. Pt is agreeable.   Apolonio Schneiders RN 07/20/22

## 2022-07-21 LAB — CERVICOVAGINAL ANCILLARY ONLY
Bacterial Vaginitis (gardnerella): POSITIVE — AB
Candida Glabrata: NEGATIVE
Candida Vaginitis: NEGATIVE
Chlamydia: NEGATIVE
Comment: NEGATIVE
Comment: NEGATIVE
Comment: NEGATIVE
Comment: NEGATIVE
Comment: NEGATIVE
Comment: NORMAL
Neisseria Gonorrhea: NEGATIVE
Trichomonas: NEGATIVE

## 2022-07-22 ENCOUNTER — Other Ambulatory Visit: Payer: Self-pay | Admitting: Family Medicine

## 2022-07-22 DIAGNOSIS — N760 Acute vaginitis: Secondary | ICD-10-CM

## 2022-07-22 MED ORDER — METRONIDAZOLE 500 MG PO TABS: 500.0000 mg | ORAL_TABLET | Freq: Two times a day (BID) | ORAL | 0 refills | Status: AC

## 2022-09-10 ENCOUNTER — Ambulatory Visit: Payer: No Typology Code available for payment source | Admitting: Certified Nurse Midwife

## 2022-09-10 DIAGNOSIS — Z01419 Encounter for gynecological examination (general) (routine) without abnormal findings: Secondary | ICD-10-CM

## 2022-09-10 NOTE — Progress Notes (Signed)
Addend: Last PAP 04/10/2019.  Fleet Contras RN

## 2022-09-12 NOTE — Progress Notes (Signed)
Pt did not come to visit.

## 2022-09-23 ENCOUNTER — Encounter: Payer: Self-pay | Admitting: *Deleted

## 2022-10-13 NOTE — Progress Notes (Unsigned)
[facility] [place] Phone: [phone] Subjective:   Rito Ehrlich, am serving as a scribe for Dr. Hulan Saas.  I'm seeing this patient by the request  of:  Vivi Barrack, MD  CC: neck and back pain   [id number]  Tarea Sophira Rumler is a 32 y.o. female coming in with complaint of back and neck pain. OMT 07/13/2022. Patient states neck and upper back tightness patient is some tenderness to palpation with some tightness of the right hip flexor she states   Medications patient has been prescribed: None  Taking:         Reviewed prior external information including notes and imaging from previsou exam, outside providers and external EMR if available.   As well as notes that were available from care everywhere and other healthcare systems.  Past medical history, social, surgical and family history all reviewed in electronic medical record.  No pertanent information unless stated regarding to the chief complaint.   Past Medical History:  Diagnosis Date   Asthma    Bipolar 2 disorder ([place]) 2007   Bipolar 2 disorder ([place])    Conversion disorder 2017   Conversion disorder    Depression 2007   Heavy metal poisoning    s/p chelation   Hypermobile joints    Hypoglycemia    PTSD (post-traumatic stress disorder)    Skin-picking disorder     Allergies  Allergen Reactions   Cyclobenzaprine Other (See Comments)    Psychosis   Penicillins Hives     Review of Systems:  No headache, visual changes, nausea, vomiting, diarrhea, constipation, dizziness, abdominal pain, skin rash, fevers, chills, night sweats, weight loss, swollen lymph nodes, body aches, joint swelling, chest pain, shortness of breath, mood changes. POSITIVE muscle aches  Objective  Blood pressure 110/80, pulse 83, height 5\' 8"  (1.727 m), weight 155 lb (70.3 kg), SpO2 98 %.   General: No apparent distress alert and oriented x3 mood and  affect normal, dressed appropriately.  HEENT: Pupils equal, extraocular movements intact  Respiratory: Patient's speak in full sentences and does not appear short of breath  Cardiovascular: No lower extremity edema, non tender, no erythema  Is noted in the paraspinal musculature of the lumbar spine.  Tightness of the left flexor still noted.  Positive FABER test on the left side as well.  Osteopathic findings  C2 flexed rotated and side bent left  C7 flexed rotated and side bent left T3 extended rotated and side bent left  inhaled rib T9 extended rotated and side bent left L1 flexed rotated and side bent right Sacrum left on left       Assessment and Plan:  Hip flexor tightness, left Left hip does have tightness, tightness of the neck noted as well.  Limited sidebending of the neck and back more then usual, lots of stress  RTC in 5-6 weeks     Nonallopathic problems  Decision today to treat with OMT was based on Physical Exam  After verbal consent patient was treated with HVLA, ME, FPR techniques in cervical, rib, thoracic, lumbar, and sacral  areas  Patient tolerated the procedure well with improvement in symptoms  Patient given exercises, stretches and lifestyle modifications  See medications in patient instructions if given  Patient will follow up in 4-8 weeks    The above documentation has been reviewed and is accurate and complete [name], DO  Note: This dictation was prepared with Dragon dictation along with smaller phrase technology. Any transcriptional errors that result from this process are unintentional.

## 2022-10-19 ENCOUNTER — Ambulatory Visit (INDEPENDENT_AMBULATORY_CARE_PROVIDER_SITE_OTHER): Payer: Medicaid Other | Admitting: Family Medicine

## 2022-10-19 VITALS — BP 110/80 | HR 83 | Ht 68.0 in | Wt 155.0 lb

## 2022-10-19 DIAGNOSIS — M9908 Segmental and somatic dysfunction of rib cage: Secondary | ICD-10-CM | POA: Diagnosis not present

## 2022-10-19 DIAGNOSIS — M9904 Segmental and somatic dysfunction of sacral region: Secondary | ICD-10-CM

## 2022-10-19 DIAGNOSIS — M9903 Segmental and somatic dysfunction of lumbar region: Secondary | ICD-10-CM | POA: Diagnosis not present

## 2022-10-19 DIAGNOSIS — M9902 Segmental and somatic dysfunction of thoracic region: Secondary | ICD-10-CM | POA: Diagnosis not present

## 2022-10-19 DIAGNOSIS — M9901 Segmental and somatic dysfunction of cervical region: Secondary | ICD-10-CM

## 2022-10-19 DIAGNOSIS — M24552 Contracture, left hip: Secondary | ICD-10-CM

## 2022-10-19 NOTE — Assessment & Plan Note (Signed)
Left hip does have tightness, tightness of the neck noted as well.  Limited sidebending of the neck and back more then usual, lots of stress  RTC in 5-6 weeks

## 2022-10-19 NOTE — Patient Instructions (Signed)
Good to see you  Make sure to take times for yourself See me again in 6 weeks

## 2022-10-20 ENCOUNTER — Other Ambulatory Visit (HOSPITAL_COMMUNITY)
Admission: RE | Admit: 2022-10-20 | Discharge: 2022-10-20 | Disposition: A | Discharge status: Home / Self Care | Payer: Medicaid Other | Source: Ambulatory Visit | Attending: Obstetrics and Gynecology | Admitting: Obstetrics and Gynecology

## 2022-10-20 ENCOUNTER — Other Ambulatory Visit: Payer: No Typology Code available for payment source | Admitting: Hematology and Oncology

## 2022-10-20 DIAGNOSIS — Z01419 Encounter for gynecological examination (general) (routine) without abnormal findings: Secondary | ICD-10-CM

## 2022-10-20 DIAGNOSIS — Z124 Encounter for screening for malignant neoplasm of cervix: Secondary | ICD-10-CM

## 2022-10-20 NOTE — Progress Notes (Signed)
Patient: Kyan Giannone           Date of Birth: April 14, 1991           MRN: [id number] Visit Date: 10/20/2022 PCP: Vivi Barrack, MD   A 32 year old with history of negative Pap smear with negative HPV.  Cervical Exam Pap smear completed: Pap test Abnormal Observations: Normal exam. Pap obtained.      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   Mild intermittent asthma without complication 58/06/9832   Past Medical History:  Diagnosis Date   Asthma    Bipolar 2 disorder ([place]) 2007   Bipolar 2 disorder ([place])    Conversion disorder 2017   Conversion disorder    Depression 2007   Heavy metal poisoning    s/p chelation   Hypermobile joints    Hypoglycemia    PTSD (post-traumatic stress disorder)    Skin-picking disorder     Family History  Problem Relation Age of Onset   Depression Mother    Liver disease Father    Blindness Father    Diabetes Father    Hypertension Father    Cancer Maternal Grandfather    Miscarriages / Stillbirths Paternal Grandmother     Social History   Occupational History   Occupation: self employed    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

## 2022-10-22 LAB — CYTOLOGY - PAP
Comment: NEGATIVE
Diagnosis: NEGATIVE
Diagnosis: REACTIVE
High risk HPV: NEGATIVE

## 2022-10-28 ENCOUNTER — Telehealth: Payer: Self-pay

## 2022-10-28 NOTE — Telephone Encounter (Signed)
I have left a detailed message for the pt advising of her normal PAP/HPV and to repeat in 5 years. Pt was invited to call us back if she wishes to discuss. Also, a MyChart message has been sent.

## 2022-11-02 ENCOUNTER — Ambulatory Visit: Payer: Medicaid Other | Admitting: Family Medicine

## 2022-11-02 VITALS — BP 104/67 | HR 84 | Temp 97.5°F | Ht 68.0 in | Wt 158.0 lb

## 2022-11-02 DIAGNOSIS — Z23 Encounter for immunization: Secondary | ICD-10-CM | POA: Diagnosis not present

## 2022-11-02 DIAGNOSIS — K9049 Malabsorption due to intolerance, not elsewhere classified: Secondary | ICD-10-CM | POA: Diagnosis not present

## 2022-11-02 DIAGNOSIS — F325 Major depressive disorder, single episode, in full remission: Secondary | ICD-10-CM

## 2022-11-02 NOTE — Patient Instructions (Signed)
It was very nice to see you today!  You may have IBS.  I will refer you to the GI doctor so that they can further evaluate.  You may need a colonoscopy.  You have plantar fasciitis in your foot.  Please work on the stretches and let us know if not proving.  Please call to schedule your first OB appointment soon.  Let me know if you need further assistance with this.  Take care, Dr Jerline Pain  PLEASE NOTE:  If you had any lab tests, please let us know if you have not heard back within a few days. You may see your results on mychart before we have a chance to review them but we will give you a call once they are reviewed by [place].   If we ordered any referrals today, please let us know if you have not heard from their office within the next week.   If you had any urgent prescriptions sent in today, please check with the pharmacy within an hour of our visit to make sure the prescription was transmitted appropriately.   Please try these tips to maintain a healthy lifestyle:  Eat at least 3 REAL meals and 1-2 snacks per day.  Aim for no more than 5 hours between eating.  If you eat breakfast, please do so within one hour of getting up.   Each meal should contain half fruits/vegetables, one quarter protein, and one quarter carbs (no bigger than a computer mouse)  Cut down on sweet beverages. This includes juice, soda, and sweet tea.   Drink at least 1 glass of water with each meal and aim for at least 8 glasses per day  Exercise at least 150 minutes every week.

## 2022-11-02 NOTE — Assessment & Plan Note (Signed)
Overall symptoms are labile.  She does have quite a bit of stress life right now however overall symptoms are manageable.

## 2022-11-02 NOTE — Progress Notes (Signed)
   Maureen [name] is a 32 y.o. female who presents today for an office visit.  Assessment/Plan:  Maureen/Acute Problems: Plantar fasciitis No red flags.  Will avoid NSAIDs due to her being pregnant.  Discussed home exercises and handout was given.  She will let me know if not proving in refer to PT or have her follow-up with sports medicine.  Food Intolerance / diarrhea No red flags.  She is concerned about IBD however history more consistent with IBS.  Recently had labs including TSH and CBC which were reassuring.  Place referral to GI for further evaluation  Pregnancy She will establish care with OB/GYN soon.  Chronic Problems Addressed Today: Major depression in remission ([name]) Overall symptoms are labile.  She does have quite a bit of stress life right now however overall symptoms are manageable.   Flu shot given today.     Subjective:  HPI:  See A/P for status of chronic conditions.  Main concern today is GI intolerance.  She has had ongoing issues for about 6 years since moving to [name] with progressively worsening dental issues.  She has been following with allergst.  Recently had testing done which showed intolerance to a trial for paxlovid.  She has noticed progressively worsening diarrhea which happens several times per week especially when she ingested any food that she is now aware that she is intolerant to.  She has not noticed any melena or hematochezia.  She has quite a bit of pain and abdominal cramping prior to her bowel movements.  No reported constipation.  No reported nausea or vomiting.  No unintentional weight loss.  No night sweats.  She also has a sore spot on the bottom of her left foot.  This is been going on for weeks.  Worse with certain motions.  Worse when putting pressure on the area.  She also recently found out she was pregnant.       Objective:  Physical Exam: BP 104/67   Pulse 84   Temp (!) 97.5 F (36.4 C) (Temporal)   Ht 5\' 8"   (1.727 [name])   Wt 158 lb (71.7 kg)   LMP 09/22/2022   SpO2 (!) 88%   BMI 24.02 kg/[name]   Gen: No acute distress, resting comfortably CV: Regular rate and rhythm with no murmurs appreciated Pulm: Normal work of breathing, clear to auscultation bilaterally with no crackles, wheezes, or rhonchi MSK: - Feet: Left foot without deformities.  Tenderness palpation along plantar fascia insertion at distal calcaneus Neuro: Grossly normal, moves all extremities Psych: Normal affect and thought content      Maureen [name]. Jerline Pain, MD 11/02/2022 1:49 PM

## 2022-11-10 ENCOUNTER — Ambulatory Visit (INDEPENDENT_AMBULATORY_CARE_PROVIDER_SITE_OTHER): Payer: Medicaid Other

## 2022-11-10 DIAGNOSIS — Z3201 Encounter for pregnancy test, result positive: Secondary | ICD-10-CM | POA: Diagnosis not present

## 2022-11-10 DIAGNOSIS — Z3200 Encounter for pregnancy test, result unknown: Secondary | ICD-10-CM

## 2022-11-10 LAB — POCT PREGNANCY, URINE: Preg Test, Ur: POSITIVE — AB

## 2022-11-10 NOTE — Progress Notes (Unsigned)
Possible Pregnancy  Here today for pregnancy confirmation. UPT in office today is positive.  I have called pt 4 times at the phone number on file. VM left x3. I have sent a MyChart message to patient.   11/11/2022  Pt is unsure about dating. She is currently scheduled for dating U/S on 02/27 at 11:00am.  Pt does report vaginal bleeding on 12/13 but has since stopped.   OB history reviewed. Reviewed medications and allergies with patient; list of medications safe to take during pregnancy given.  Recommended pt begin prenatal vitamin and schedule prenatal care.  Martina Sinner, RN 11/10/2022  1:55 PM

## 2022-11-11 ENCOUNTER — Telehealth: Payer: Self-pay

## 2022-11-11 NOTE — Telephone Encounter (Signed)
Called patient 3 times to discuss recent positive pregnancy test that she dropped of yesterday (01/31). VM leftx3. Requested patient call back or send MyChart message. Will attempt to contact at a later time.

## 2022-11-11 NOTE — Telephone Encounter (Signed)
Spoke with patient about UPT left in office yesterday (01/31). See note.

## 2022-11-25 NOTE — Progress Notes (Signed)
[name] [address] [place] [address] Phone: [phone] Subjective:   Bruce Donath, am serving as a scribe for Dr. Antoine Primas.  I'm seeing this patient by the request  of:  Ardith Dark, MD  CC: back and neck pain   [id number]  Maureen [name] is a 32 y.o. female coming in with complaint of back and neck pain. OMT on 10/19/2022. Patient states that neck has been bothering her for the past few days. Feels like neck is off of center. Having more headaches recently.           Reviewed prior external information including notes and imaging from previsou exam, outside providers and external EMR if available.   As well as notes that were available from care everywhere and other healthcare systems.  Past medical history, social, surgical and family history all reviewed in electronic medical record.  No pertanent information unless stated regarding to the chief complaint.   Past Medical History:  Diagnosis Date   Asthma    Bipolar 2 disorder (HCC) 2007   Bipolar 2 disorder (HCC)    Conversion disorder 2017   Conversion disorder    Depression 2007   Heavy metal poisoning    s/p chelation   Hypermobile joints    Hypoglycemia    PTSD (post-traumatic stress disorder)    Skin-picking disorder     Allergies  Allergen Reactions   Cyclobenzaprine Other (See Comments)    Psychosis   Penicillins Hives     Review of Systems:  No headache, visual changes, nausea, vomiting, diarrhea, constipation, dizziness, abdominal pain, skin rash, fevers, chills, night sweats, weight loss, swollen lymph nodes, body aches, joint swelling, chest pain, shortness of breath, mood changes. POSITIVE muscle aches  Objective  Blood pressure 100/64, height 5\' 8"  (1.727 m), weight 159 lb (72.1 kg).   General: No apparent distress alert and oriented x3 mood and affect normal, dressed appropriately.  HEENT: Pupils equal, extraocular movements  intact  Respiratory: Patient's speak in full sentences and does not appear short of breath  Cardiovascular: No lower extremity edema, non tender, no erythema  Low back exam does have some tightness noted but deferred the rest of the exam.  Neck exam does have some loss of lordosis.  Some tenderness to palpation in the paraspinal musculature.  Osteopathic findings  C2 flexed rotated and side bent left C6 flexed rotated and side bent left       Assessment and Plan:  Neck pain Underlying hypermobility.  Manipulated patient cervical spine but did not do anything in the lower spine secondary to patient's being in the first trimester.  Discussed with patient about icing regimen and home exercises, which activities to do and which ones to avoid.  Increase activity slowly.  Patient will follow-up with me again in 4 to 5 weeks otherwise.    Nonallopathic problems  Decision today to treat with OMT was based on Physical Exam  After verbal consent patient was treated with HVLA, ME, FPR techniques in cervical,  areas deferred the rest of the exam secondary to patient being recently pregnant in the first trimester.  Patient tolerated the procedure well with improvement in symptoms  Patient given exercises, stretches and lifestyle modifications  See medications in patient instructions if given  Patient will follow up in 4-8 weeks     The above documentation has been reviewed and is accurate and complete Judi Saa, DO  Note: This dictation was prepared with Dragon dictation along with smaller phrase technology. Any transcriptional errors that result from this process are unintentional.

## 2022-11-30 ENCOUNTER — Ambulatory Visit: Payer: Medicaid Other | Admitting: Family Medicine

## 2022-11-30 VITALS — BP 100/64 | Ht 68.0 in | Wt 159.0 lb

## 2022-11-30 DIAGNOSIS — M9901 Segmental and somatic dysfunction of cervical region: Secondary | ICD-10-CM | POA: Diagnosis not present

## 2022-11-30 DIAGNOSIS — M542 Cervicalgia: Secondary | ICD-10-CM | POA: Diagnosis not present

## 2022-11-30 NOTE — Patient Instructions (Signed)
Congrats See me in 5-6 weeks

## 2022-11-30 NOTE — Assessment & Plan Note (Signed)
Underlying hypermobility.  Manipulated patient cervical spine but did not do anything in the lower spine secondary to patient's being in the first trimester.  Discussed with patient about icing regimen and home exercises, which activities to do and which ones to avoid.  Increase activity slowly.  Patient will follow-up with me again in 4 to 5 weeks otherwise.

## 2022-12-07 ENCOUNTER — Other Ambulatory Visit: Payer: Self-pay | Admitting: Family Medicine

## 2022-12-07 ENCOUNTER — Ambulatory Visit (INDEPENDENT_AMBULATORY_CARE_PROVIDER_SITE_OTHER): Payer: Medicaid Other

## 2022-12-07 DIAGNOSIS — O3680X0 Pregnancy with inconclusive fetal viability, not applicable or unspecified: Secondary | ICD-10-CM

## 2022-12-07 DIAGNOSIS — Z3A11 11 weeks gestation of pregnancy: Secondary | ICD-10-CM

## 2022-12-08 ENCOUNTER — Encounter: Payer: Self-pay | Admitting: Physician Assistant

## 2023-01-06 NOTE — Progress Notes (Signed)
[name] [address] [place] [address] Phone: [phone] Subjective:   Bruce Donath, am serving as a scribe for Dr. Antoine Primas.  I'm seeing this patient by the request  of:  Ardith Dark, MD  CC: Back and neck pain follow-up  [id number]  Maureen [name] is a 32 y.o. female coming in with complaint of back and neck pain. OMT on 11/30/2022. Patient states that her headaches came back the same day as the adjustment from last visit. Does not feel tightness in neck or thoracic spine despite getting more headaches.   Also c/o L hip and lower back pain that feels as if the leg is going to give out on her. Feels disconnect between the nerves in her hip. Has not been walking when she has these symptoms.   Medications patient has been prescribed:   Taking:         Reviewed prior external information including notes and imaging from previsou exam, outside providers and external EMR if available.   As well as notes that were available from care everywhere and other healthcare systems.  Past medical history, social, surgical and family history all reviewed in electronic medical record.  No pertanent information unless stated regarding to the chief complaint.   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 POSITIVE muscle aches  Objective  Blood pressure 102/74, pulse 98, height 5\' 8"  (1.727 m), weight 159 lb (72.1 kg), last menstrual period 09/22/2022, SpO2 96 %.   General: No apparent distress alert and oriented x3 mood and affect normal, dressed appropriately.  HEENT: Pupils equal, extraocular movements intact  Respiratory: Patient's speak in full sentences and does not appear short of breath  Cardiovascular: No lower extremity edema, non tender, no erythema  Patient is gravid.  Tender to palpation more in the thoracolumbar juncture.  Somewhat in the neck as well bilaterally.  Some limited range of motion with extension of the neck which is worse than usual.  Osteopathic findings  C3 flexed rotated and side bent right C7 flexed rotated and side bent left T3 extended rotated and side bent right inhaled rib T9 extended rotated and side bent left L2 flexed rotated and side bent right Sacrum right on right    Assessment and Plan:  Neck pain Neck pain with some increasing headaches noted.  Patient does pregnant at the moment.  Likely some increase in the progesterone.  Discussed with patient about different home exercises, icing regimen, which activities to do and which ones to avoid.  Increase activity slowly.  Patient knows if any shortness of breath  or worsening things follow-up with primary care or go to the emergency room.  Follow-up with me again in 4-6 weeks.    Nonallopathic problems  Decision today to treat with OMT was based on Physical Exam  After verbal consent patient was treated with HVLA, ME, FPR techniques in cervical, rib, thoracic, lumbar, and sacral  areas  Patient tolerated the procedure well with improvement in symptoms  Patient given exercises, stretches and  lifestyle modifications  See medications in patient instructions if given  Patient will follow up in 4-8 weeks    The above documentation has been reviewed and is accurate and complete Maureen SaaZachary M Leiliana Foody, DO          Note: This dictation was prepared with Dragon dictation along with smaller phrase technology. Any transcriptional errors that result from this process are unintentional.

## 2023-01-12 ENCOUNTER — Telehealth (INDEPENDENT_AMBULATORY_CARE_PROVIDER_SITE_OTHER): Payer: Medicaid Other | Admitting: *Deleted

## 2023-01-12 ENCOUNTER — Encounter: Payer: Self-pay | Admitting: *Deleted

## 2023-01-12 DIAGNOSIS — Z3689 Encounter for other specified antenatal screening: Secondary | ICD-10-CM

## 2023-01-12 DIAGNOSIS — O0990 Supervision of high risk pregnancy, unspecified, unspecified trimester: Secondary | ICD-10-CM | POA: Insufficient documentation

## 2023-01-12 DIAGNOSIS — O09299 Supervision of pregnancy with other poor reproductive or obstetric history, unspecified trimester: Secondary | ICD-10-CM

## 2023-01-12 NOTE — Progress Notes (Signed)
New OB Intake  I connected with Maureen [name]  on 01/12/23 at 1:25 by MyChart Video Visit and verified that I am speaking with the correct person using two identifiers. Nurse is located at [facility] and pt is located at home.  I discussed the limitations, risks, security and privacy concerns of performing an evaluation and management service by telephone and the availability of in person appointments. I also discussed with the patient that there may be a patient responsible charge related to this service. The patient expressed understanding and agreed to proceed.  I explained I am completing New OB Intake today. We discussed EDD of 06/22/23 that is based on [place]. Patient reports she normally has very regular periods but in November had some episodes of bleeding then had normal 5 day period starting 09/23/23. Pt is G3/P1010. I reviewed her allergies, medications, Medical/Surgical/OB history, and appropriate screenings. I informed her of [facility] services. [facility] information placed in AVS. Based on history, this is a high risk pregnancy.  Patient Active Problem List   Diagnosis Date Noted   History of fetal anomaly in prior pregnancy, currently pregnant 01/12/2023   Supervision of high risk pregnancy, antepartum 01/12/2023   Right shoulder pain 05/28/2021   Neck pain 01/12/2021   Nonallopathic lesion of sacral region 03/05/2019   Nonallopathic lesion of lumbosacral region 03/05/2019   Nonallopathic lesion of thoracic region 03/05/2019   Hip flexor tightness, left 02/21/2019   Vitamin D deficiency 02/21/2019   Hypermobility syndrome 02/21/2019   Left hip pain 01/03/2019   Bipolar disorder 01/03/2019   PTSD (post-traumatic stress disorder) 01/03/2019   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 Patient given information for [facility] Healthy Baby website for more  information about [facility] and [place]. Patient is not interested in water birth.  MyChart/Babyscripts MyChart access verified. I explained pt will have some visits in office and some virtually. Babyscripts instructions given and order placed. Patient verifies receipt of registration text/e-mail.  Blood Pressure Cuff/Weight Scale Patient has blood pressure cuff from last pregnancy- will make sure it works and let us know at new ob if it does not work.   Explained after first prenatal appt pt will check weekly and document in 93. Patient does have weight scale.  Anatomy US Explained first scheduled [place] will be around 19 weeks or first available. Anatomy US scheduled for 02/11/23 at 0945. Pt notified to arrive at 0930. I informed her we would prefer US done at 19 weeks but no appointments available but she may call as her schedule allows to see if there is a cancellation and she may be able to come sooner.   Labs Discussed Johnsie Cancel genetic screening with patient. Would like  Panorama  drawn at new OB visit. Routine prenatal labs needed.  COVID Vaccine Patient has had COVID vaccine.   Is patient a CenteringPregnancy candidate?  Not discussed     Is patient a Mom+Baby Combined Care candidate?  Not a candidate    Social Determinants of Health Food Insecurity: Patient denies food insecurity. WIC Referral: Patient is interested in referral to [facility].  Transportation: Patient denies transportation needs. Childcare: Discussed no children allowed at ultrasound appointments. Offered childcare services; patient declines childcare services at this time.  Interested in [place]?  No    First visit review I reviewed new OB appt with patient. I explained they will have  a provider visit that includes exam/ bloodwork. Explained pt will be seen by Dr. Ilda Basset at first visit; encounter routed to appropriate provider. She states she plans to move In July.   Maureen [name] 01/12/2023  3:01 PM

## 2023-01-14 ENCOUNTER — Ambulatory Visit: Payer: Medicaid Other | Admitting: Physician Assistant

## 2023-01-14 ENCOUNTER — Encounter: Payer: Self-pay | Admitting: Family Medicine

## 2023-01-14 ENCOUNTER — Ambulatory Visit (INDEPENDENT_AMBULATORY_CARE_PROVIDER_SITE_OTHER): Payer: Medicaid Other | Admitting: Family Medicine

## 2023-01-14 VITALS — BP 102/74 | HR 98 | Ht 68.0 in | Wt 159.0 lb

## 2023-01-14 DIAGNOSIS — M542 Cervicalgia: Secondary | ICD-10-CM | POA: Diagnosis not present

## 2023-01-14 DIAGNOSIS — M9902 Segmental and somatic dysfunction of thoracic region: Secondary | ICD-10-CM | POA: Diagnosis not present

## 2023-01-14 DIAGNOSIS — M9901 Segmental and somatic dysfunction of cervical region: Secondary | ICD-10-CM | POA: Diagnosis not present

## 2023-01-14 DIAGNOSIS — M9903 Segmental and somatic dysfunction of lumbar region: Secondary | ICD-10-CM | POA: Diagnosis not present

## 2023-01-14 DIAGNOSIS — M9904 Segmental and somatic dysfunction of sacral region: Secondary | ICD-10-CM | POA: Diagnosis not present

## 2023-01-14 DIAGNOSIS — M9908 Segmental and somatic dysfunction of rib cage: Secondary | ICD-10-CM

## 2023-01-14 NOTE — Assessment & Plan Note (Signed)
Neck pain with some increasing headaches noted.  Patient does pregnant at the moment.  Likely some increase in the progesterone.  Discussed with patient about different home exercises, icing regimen, which activities to do and which ones to avoid.  Increase activity slowly.  Patient knows if any shortness of breath or worsening things follow-up with primary care or go to the emergency room.  Follow-up with me again in 4-6 weeks.

## 2023-01-14 NOTE — Patient Instructions (Signed)
Good to see you Sad you are moving See me in 5 weeks

## 2023-01-17 ENCOUNTER — Encounter: Payer: Self-pay | Admitting: Obstetrics and Gynecology

## 2023-01-17 DIAGNOSIS — Z98891 History of uterine scar from previous surgery: Secondary | ICD-10-CM | POA: Insufficient documentation

## 2023-01-19 ENCOUNTER — Encounter: Payer: Medicaid Other | Admitting: Obstetrics and Gynecology

## 2023-02-11 ENCOUNTER — Ambulatory Visit: Payer: Medicaid Other | Attending: Obstetrics and Gynecology

## 2023-02-11 ENCOUNTER — Ambulatory Visit: Payer: Medicaid Other | Admitting: *Deleted

## 2023-02-11 ENCOUNTER — Encounter: Payer: Self-pay | Admitting: *Deleted

## 2023-02-11 VITALS — BP 115/72 | HR 91

## 2023-02-11 DIAGNOSIS — O0990 Supervision of high risk pregnancy, unspecified, unspecified trimester: Secondary | ICD-10-CM | POA: Diagnosis present

## 2023-02-11 DIAGNOSIS — O09292 Supervision of pregnancy with other poor reproductive or obstetric history, second trimester: Secondary | ICD-10-CM | POA: Diagnosis not present

## 2023-02-11 DIAGNOSIS — Z3A21 21 weeks gestation of pregnancy: Secondary | ICD-10-CM

## 2023-02-11 DIAGNOSIS — O09299 Supervision of pregnancy with other poor reproductive or obstetric history, unspecified trimester: Secondary | ICD-10-CM | POA: Diagnosis present

## 2023-02-11 DIAGNOSIS — Z8279 Family history of other congenital malformations, deformations and chromosomal abnormalities: Secondary | ICD-10-CM | POA: Diagnosis not present

## 2023-02-15 NOTE — Progress Notes (Unsigned)
[name] [address] [place] [address] Phone: [phone] Subjective:   Bruce Donath, am serving as a scribe for Dr. Antoine Primas.  I'm seeing this patient by the request  of:  Ardith Dark, MD  CC: Neck and back pain follow-up  [id number]  Maureen [name] is a 32 y.o. female coming in with complaint of back and neck pain. OMT on 01/14/2023. Patient states that L side of lower back and hip had a pinch in that area but pain has gone away. Intermittent neck pain.   Medications patient has been prescribed: None  Taking:         Reviewed prior external information including notes and imaging from previsou exam, outside providers and external EMR if available.   As well as notes that were available from care everywhere and other healthcare systems.  Past medical history, social, surgical and family history all reviewed in electronic medical record.  No pertanent information unless stated regarding to the chief complaint.   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), with abnormal movement    Heavy metal poisoning    s/p chelation   Hypermobile joints    Hypoglycemia    Left hip pain 01/03/2019   Mild intermittent asthma without complication 01/03/2019   Nonallopathic lesion of lumbosacral region 03/05/2019   Nonallopathic lesion of sacral region 03/05/2019   Nonallopathic lesion of thoracic region 03/05/2019   PTSD (post-traumatic stress disorder)    Right shoulder pain 05/28/2021   Skin-picking disorder    Vitamin D deficiency 02/21/2019    Allergies  Allergen Reactions   Cyclobenzaprine Other (See Comments)    Psychosis   Penicillins Hives     Review of Systems:  No headache,  visual changes, nausea, vomiting, diarrhea, constipation, dizziness, abdominal pain, skin rash, fevers, chills, night sweats, weight loss, swollen lymph nodes, body aches, joint swelling, chest pain, shortness of breath, mood changes. POSITIVE muscle aches  Objective  Blood pressure 102/62, pulse 64, height 5\' 8"  (1.727 m), weight 165 lb (74.8 kg), last menstrual period 09/22/2022, SpO2 97 %.   General: No apparent distress alert and oriented x3 mood and affect normal, dressed appropriately.  HEENT: Pupils equal, extraocular movements intact  Respiratory: Patient's speak in full sentences and does not appear short of breath  Cardiovascular: No lower extremity edema, non tender, no erythema  Hypermobility noted.  Patient is gravid.  Mild increase in lordosis of the lumbar spine noted today. Osteopathic findings  C2 flexed rotated and side bent right T3 extended rotated and side bent right inhaled rib T9 extended rotated and side bent left L2 flexed rotated and side bent right Sacrum right on right       Assessment and Plan:  Neck pain Mild increase in tightness noted.  Patient is in the second trimester progesterone.  Discussed with patient icing regimen and home exercises, which activities to do and which ones to avoid, increase activity slowly over the course of next several days to weeks.  Follow-up with me again in 6 to 8 weeks otherwise.    Nonallopathic problems  Decision today to treat with OMT was based on Physical Exam  After verbal consent patient was treated with  ME,  FPR techniques in cervical, rib, thoracic, lumbar, and sacral  areas  Patient tolerated the procedure well with improvement in symptoms  Patient given exercises, stretches and lifestyle modifications  See medications in patient instructions if given  Patient will follow up in 4-5 weeks     The above documentation has been reviewed and is accurate and complete Judi Saa, DO         Note:  This dictation was prepared with Dragon dictation along with smaller phrase technology. Any transcriptional errors that result from this process are unintentional.

## 2023-02-16 ENCOUNTER — Ambulatory Visit (INDEPENDENT_AMBULATORY_CARE_PROVIDER_SITE_OTHER): Payer: Medicaid Other | Admitting: Family Medicine

## 2023-02-16 VITALS — BP 102/62 | HR 64 | Ht 68.0 in | Wt 165.0 lb

## 2023-02-16 DIAGNOSIS — M9904 Segmental and somatic dysfunction of sacral region: Secondary | ICD-10-CM

## 2023-02-16 DIAGNOSIS — M542 Cervicalgia: Secondary | ICD-10-CM

## 2023-02-16 DIAGNOSIS — M9901 Segmental and somatic dysfunction of cervical region: Secondary | ICD-10-CM | POA: Diagnosis not present

## 2023-02-16 DIAGNOSIS — M9903 Segmental and somatic dysfunction of lumbar region: Secondary | ICD-10-CM | POA: Diagnosis not present

## 2023-02-16 DIAGNOSIS — M9908 Segmental and somatic dysfunction of rib cage: Secondary | ICD-10-CM

## 2023-02-16 DIAGNOSIS — M9902 Segmental and somatic dysfunction of thoracic region: Secondary | ICD-10-CM

## 2023-02-16 NOTE — Patient Instructions (Addendum)
Good to see you Glad scan went great See me again in 5 weeks

## 2023-02-16 NOTE — Assessment & Plan Note (Signed)
Mild increase in tightness noted.  Patient is in the second trimester progesterone.  Discussed with patient icing regimen and home exercises, which activities to do and which ones to avoid, increase activity slowly over the course of next several days to weeks.  Follow-up with me again in 6 to 8 weeks otherwise.

## 2023-02-17 ENCOUNTER — Encounter: Payer: Self-pay | Admitting: Obstetrics and Gynecology

## 2023-02-17 ENCOUNTER — Other Ambulatory Visit: Payer: Self-pay

## 2023-02-17 ENCOUNTER — Ambulatory Visit (INDEPENDENT_AMBULATORY_CARE_PROVIDER_SITE_OTHER): Payer: Medicaid Other | Admitting: Obstetrics and Gynecology

## 2023-02-17 ENCOUNTER — Other Ambulatory Visit (HOSPITAL_COMMUNITY)
Admission: RE | Admit: 2023-02-17 | Discharge: 2023-02-17 | Disposition: A | Discharge status: Home / Self Care | Payer: Medicaid Other | Source: Ambulatory Visit | Attending: Obstetrics and Gynecology | Admitting: Obstetrics and Gynecology

## 2023-02-17 VITALS — BP 105/69 | HR 81 | Wt 165.1 lb

## 2023-02-17 DIAGNOSIS — O0992 Supervision of high risk pregnancy, unspecified, second trimester: Secondary | ICD-10-CM

## 2023-02-17 DIAGNOSIS — O09292 Supervision of pregnancy with other poor reproductive or obstetric history, second trimester: Secondary | ICD-10-CM | POA: Diagnosis not present

## 2023-02-17 DIAGNOSIS — N898 Other specified noninflammatory disorders of vagina: Secondary | ICD-10-CM

## 2023-02-17 DIAGNOSIS — Z98891 History of uterine scar from previous surgery: Secondary | ICD-10-CM

## 2023-02-17 DIAGNOSIS — O09299 Supervision of pregnancy with other poor reproductive or obstetric history, unspecified trimester: Secondary | ICD-10-CM

## 2023-02-17 DIAGNOSIS — Z3A22 22 weeks gestation of pregnancy: Secondary | ICD-10-CM

## 2023-02-17 DIAGNOSIS — O0990 Supervision of high risk pregnancy, unspecified, unspecified trimester: Secondary | ICD-10-CM

## 2023-02-17 NOTE — Progress Notes (Signed)
Subjective:   Maureen [name] is a 32 y.o. G3P1011 at [redacted]w[redacted]d by early ultrasound being seen today for her first obstetrical visit.  Her obstetrical history is significant for has Bipolar disorder (HCC); Major depression in remission (HCC); Hip flexor tightness, left; Hypermobility syndrome; Neck pain; History of fetal anomaly in prior pregnancy, currently pregnant; Supervision of high risk pregnancy, antepartum; and History of cesarean delivery on their problem list.. Patient does intend to breast feed. Pregnancy history fully reviewed.  Patient reports: vaginal discharge, itching, history of BV  HISTORY: OB History  Gravida Para Term Preterm AB Living  3 1 1  0 1 1  SAB IAB Ectopic Multiple Live Births  0 0 0 0 1    # Outcome Date GA Lbr Len/2nd Weight Sex Delivery Anes PTL Lv  3 Current           2 Term 10/24/20 [redacted]w[redacted]d  6 lb 12.1 oz (3.065 kg) M CS-LTranv EPI       Birth Comments: severe rash, restless leg syndrome  1 AB 2015 [redacted]w[redacted]d    SAB        Birth Comments: states by lmp was 19 weeks but baby measured 14 weeks   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), with abnormal movement    Heavy metal poisoning    s/p chelation   Hypermobile joints    Hypoglycemia    Left hip pain 01/03/2019   Mild intermittent asthma without complication 01/03/2019   Nonallopathic lesion of lumbosacral region 03/05/2019   Nonallopathic lesion of sacral region 03/05/2019   Nonallopathic lesion of thoracic region 03/05/2019   PTSD (post-traumatic stress disorder)    Right shoulder pain 05/28/2021   Skin-picking disorder    Vitamin D deficiency 02/21/2019   Past Surgical History:  Procedure Laterality Date   CESAREAN SECTION  10/24/2020   MULTIPLE TOOTH  EXTRACTIONS     WISDOM TOOTH EXTRACTION     Social History   Tobacco Use   Smoking status: Never   Smokeless tobacco: Never  Vaping Use   Vaping Use: Never used  Substance Use Topics   Alcohol use: Not Currently    Comment: Rare   Drug use: Never   Allergies  Allergen Reactions   Cyclobenzaprine Other (See Comments)    Psychosis   Penicillins Hives   Current Outpatient Medications on File Prior to Visit  Medication Sig Dispense Refill   Cholecalciferol (VITAMIN D3) 50 MCG (2000 UT) capsule Take 2,000 Units by mouth daily.     Magnesium 250 MG TABS Take by mouth.     Prenatal Vit-Fe Fumarate-FA (PRENATAL VITAMIN PO) Take 1 tablet by mouth daily. Rainbow lite     No current facility-administered medications on file prior to visit.     Indications for ASA therapy (per uptodate) One of the following: Previous pregnancy with preeclampsia, especially early onset and with an adverse outcome No Multifetal gestation No Chronic hypertension No Type 1 or 2 diabetes mellitus No Chronic kidney disease No Autoimmune disease (antiphospholipid syndrome, systemic lupus erythematosus) No  Two or more of the following: Nulliparity No Obesity (body mass index >30 kg/m2) No Family history of preeclampsia in mother or sister No Age ?35 years No Sociodemographic characteristics (African  American race, low socioeconomic level) No Personal risk factors (eg, previous pregnancy with low birth weight or small for gestational age infant, previous adverse pregnancy outcome [eg, stillbirth], interval >10 years between pregnancies) No  Indications for early 1 hour GTT (per uptodate)  BMI >25 (>23 in Asian women) AND one of the following  Gestational diabetes mellitus in a previous pregnancy No Glycated hemoglobin ?5.7 percent (39 mmol/mol), impaired glucose tolerance, or impaired fasting glucose on previous testing No First-degree relative with diabetes No High-risk race/ethnicity (eg, African  American, Latino, Native American, [place] American, Pacific Islander) No History of cardiovascular disease No Hypertension or on therapy for hypertension No High-density lipoprotein cholesterol level <35 mg/dL (0.10 mmol/L) and/or a triglyceride level >250 mg/dL (2.72 mmol/L) No Polycystic ovary syndrome No Physical inactivity Yes Other clinical condition associated with insulin resistance (eg, severe obesity, acanthosis nigricans) No Previous birth of an infant weighing ?4000 g No Previous stillbirth of unknown cause No Exam   Vitals:   02/17/23 0918  BP: 105/69  Pulse: 81  Weight: 165 lb 1.6 oz (74.9 kg)   Fetal Heart Rate (bpm): 144  System: General: well-developed, well-nourished female in no acute distress   \    Skin: normal coloration and turgor, no rashes   Neurologic: oriented, normal, negative, normal mood   Extremities: normal strength, tone, and muscle mass, ROM of all joints is normal   HEENT PERRLA, extraocular movement intact and sclera clear, anicteric   Mouth/Teeth mucous membranes moist   Neck supple    Cardiovascular: regular rate and rhythm   Respiratory:  no respiratory distress,    Abdomen: soft, non-tender; gravid     Assessment:   Pregnancy: G3P1011 Patient Active Problem List   Diagnosis Date Noted   History of cesarean delivery 01/17/2023   History of fetal anomaly in prior pregnancy, currently pregnant 01/12/2023   Supervision of high risk pregnancy, antepartum 01/12/2023   Neck pain 01/12/2021   Hip flexor tightness, left 02/21/2019   Hypermobility syndrome 02/21/2019   Bipolar disorder (HCC) 01/03/2019   Major depression in remission (HCC) 01/03/2019     Plan:    1. Supervision of high risk pregnancy, antepartum Moving to [facility] in July U/s 5/3 WNL  Initial labs drawn. Continue prenatal vitamins. Discussed and offered genetic screening options, including Quad screen/AFP, NIPS testing, and option to decline testing.  Benefits/risks/alternatives reviewed. Pt aware that anatomy US is form of genetic screening with lower accuracy in detecting trisomies than blood work.  Pt chooses/declines genetic screening today. First trimester screen, Quad screen, and NIPS: ordered. Problem list reviewed and updated. The nature of [facility] with multiple MDs and other Advanced Practice Providers was explained to patient; also emphasized that residents, students are part of our team. Routine obstetric precautions reviewed.  2. History of fetal anomaly in prior pregnancy, currently pregnant  3. History of cesarean delivery C/s for failed induction and FHR Discussed at length VBAC vs TOLAC, patient undecided will continue to discuss in subsequent visits Worried about timing of delivery because she cares for her autistic son support is just she and her husband. Worried about IOL   4. Vaginal discharge Hx of BV, taking probiotics that help Collecting CV swab today   Future Appointments  Date Time Provider Department Center  03/18/2023  8:15 AM Reva Bores, MD [facility]  03/18/2023  8:50 AM [facility]  03/23/2023  1:30 PM Judi Saa, DO [facility] None  Albertine Grates, FNP

## 2023-02-18 LAB — CERVICOVAGINAL ANCILLARY ONLY
Bacterial Vaginitis (gardnerella): NEGATIVE
Candida Glabrata: NEGATIVE
Candida Vaginitis: POSITIVE — AB
Chlamydia: NEGATIVE
Comment: NEGATIVE
Comment: NEGATIVE
Comment: NEGATIVE
Comment: NEGATIVE
Comment: NORMAL
Neisseria Gonorrhea: NEGATIVE

## 2023-02-18 MED ORDER — FLUCONAZOLE 150 MG PO TABS
150.0000 mg | ORAL_TABLET | Freq: Once | ORAL | 0 refills | Status: AC
Start: 2023-02-18 — End: 2023-02-18

## 2023-02-18 NOTE — Addendum Note (Signed)
Addended by: Sue Lush on: 02/18/2023 04:44 PM   Modules accepted: Orders

## 2023-02-19 LAB — URINE CULTURE, OB REFLEX: Organism ID, Bacteria: NO GROWTH

## 2023-02-19 LAB — CULTURE, OB URINE

## 2023-02-23 LAB — AFP, SERUM, OPEN SPINA BIFIDA
AFP Value: 44.1 ng/mL
Gest. Age on Collection Date: 22.1 weeks

## 2023-02-23 LAB — CBC/D/PLT+RPR+RH+ABO+RUBIGG...
Basophils Absolute: 0 10*3/uL (ref 0.0–0.2)
Basos: 0 %
Eos: 1 %
Hemoglobin: 12.1 g/dL (ref 11.1–15.9)
Hepatitis B Surface Ag: NEGATIVE
Immature Granulocytes: 2 %
Lymphocytes Absolute: 1.8 10*3/uL (ref 0.7–3.1)
Monocytes Absolute: 0.4 10*3/uL (ref 0.1–0.9)
Monocytes: 5 %
RBC: 3.53 x10E6/uL — ABNORMAL LOW (ref 3.77–5.28)
RPR Ser Ql: NONREACTIVE
WBC: 8.5 10*3/uL (ref 3.4–10.8)

## 2023-02-24 LAB — AFP, SERUM, OPEN SPINA BIFIDA
AFP MoM: 0.6
Maternal Age At EDD: 31.1 yr
Weight: 165 [lb_av]

## 2023-02-24 LAB — CBC/D/PLT+RPR+RH+ABO+RUBIGG...
Antibody Screen: NEGATIVE
EOS (ABSOLUTE): 0 10*3/uL (ref 0.0–0.4)
HCV Ab: NONREACTIVE
HIV Screen 4th Generation wRfx: NONREACTIVE
Hematocrit: 35.3 % (ref 34.0–46.6)
Immature Grans (Abs): 0.1 10*3/uL (ref 0.0–0.1)
Lymphs: 21 %
MCH: 34.3 pg — ABNORMAL HIGH (ref 26.6–33.0)
MCHC: 34.3 g/dL (ref 31.5–35.7)
MCV: 100 fL — ABNORMAL HIGH (ref 79–97)
Neutrophils Absolute: 6.1 10*3/uL (ref 1.4–7.0)
Neutrophils: 71 %
Platelets: 157 10*3/uL (ref 150–450)
RDW: 12.7 % (ref 11.7–15.4)
Rh Factor: POSITIVE
Rubella Antibodies, IGG: 1.87 index (ref >0.99)

## 2023-02-24 LAB — HCV INTERPRETATION

## 2023-02-24 LAB — HEMOGLOBIN A1C
Est. average glucose Bld gHb Est-mCnc: 94 mg/dL
Hgb A1c MFr Bld: 4.9 % (ref 4.8–5.6)

## 2023-02-26 LAB — PANORAMA PRENATAL TEST FULL PANEL:PANORAMA TEST PLUS 5 ADDITIONAL MICRODELETIONS: FETAL FRACTION: 10.5

## 2023-03-03 LAB — HORIZON CUSTOM: REPORT SUMMARY: NEGATIVE

## 2023-03-18 ENCOUNTER — Other Ambulatory Visit (HOSPITAL_COMMUNITY)
Admission: RE | Admit: 2023-03-18 | Discharge: 2023-03-18 | Disposition: A | Discharge status: Home / Self Care | Payer: Medicaid Other | Source: Ambulatory Visit | Attending: Family Medicine | Admitting: Family Medicine

## 2023-03-18 ENCOUNTER — Other Ambulatory Visit: Payer: Medicaid Other

## 2023-03-18 ENCOUNTER — Ambulatory Visit (INDEPENDENT_AMBULATORY_CARE_PROVIDER_SITE_OTHER): Payer: Medicaid Other | Admitting: Family Medicine

## 2023-03-18 ENCOUNTER — Other Ambulatory Visit: Payer: Self-pay

## 2023-03-18 VITALS — BP 115/73 | HR 82 | Wt 170.5 lb

## 2023-03-18 DIAGNOSIS — Z23 Encounter for immunization: Secondary | ICD-10-CM

## 2023-03-18 DIAGNOSIS — O09892 Supervision of other high risk pregnancies, second trimester: Secondary | ICD-10-CM

## 2023-03-18 DIAGNOSIS — N898 Other specified noninflammatory disorders of vagina: Secondary | ICD-10-CM | POA: Diagnosis present

## 2023-03-18 DIAGNOSIS — Z862 Personal history of diseases of the blood and blood-forming organs and certain disorders involving the immune mechanism: Secondary | ICD-10-CM

## 2023-03-18 DIAGNOSIS — O0992 Supervision of high risk pregnancy, unspecified, second trimester: Secondary | ICD-10-CM

## 2023-03-18 DIAGNOSIS — Z3A26 26 weeks gestation of pregnancy: Secondary | ICD-10-CM

## 2023-03-18 DIAGNOSIS — Z98891 History of uterine scar from previous surgery: Secondary | ICD-10-CM

## 2023-03-18 DIAGNOSIS — O0990 Supervision of high risk pregnancy, unspecified, unspecified trimester: Secondary | ICD-10-CM

## 2023-03-18 DIAGNOSIS — Z2839 Other underimmunization status: Secondary | ICD-10-CM

## 2023-03-18 NOTE — Addendum Note (Signed)
Addended by: Maxwell Marion E on: 03/18/2023 12:33 PM   Modules accepted: Orders

## 2023-03-18 NOTE — Progress Notes (Signed)
   PRENATAL VISIT NOTE  Subjective:  Maureen [name] is a 32 y.o. G3P1011 at [redacted]w[redacted]d being seen today for ongoing prenatal care.  She is currently monitored for the following issues for this high-risk pregnancy and has Bipolar disorder (HCC); Major depression in remission (HCC); Hip flexor tightness, left; Hypermobility syndrome; Neck pain; History of fetal anomaly in prior pregnancy, currently pregnant; Supervision of high risk pregnancy, antepartum; History of cesarean delivery; and Maternal varicella, non-immune on their problem list.  Patient reports no complaints.  Contractions: Not present. Vag. Bleeding: None.  Movement: Present. Denies leaking of fluid.   The following portions of the patient's history were reviewed and updated as appropriate: allergies, current medications, past family history, past medical history, past social history, past surgical history and problem list.   Objective:   Vitals:   03/18/23 0825  BP: 115/73  Pulse: 82  Weight: 170 lb 8 oz (77.3 kg)    Fetal Status: Fetal Heart Rate (bpm): 130   Movement: Present     General:  Alert, oriented and cooperative. Patient is in no acute distress.  Skin: Skin is warm and dry. No rash noted.   Cardiovascular: Normal heart rate noted  Respiratory: Normal respiratory effort, no problems with respiration noted  Abdomen: Soft, gravid, appropriate for gestational age.  Pain/Pressure: Absent     Pelvic: Cervical exam deferred        Extremities: Normal range of motion.  Edema: None  Mental Status: Normal mood and affect. Normal behavior. Normal judgment and thought content.   Assessment and Plan:  Pregnancy: G3P1011 at [redacted]w[redacted]d 1. Supervision of high risk pregnancy, antepartum 28 week labs and TDaP today - Glucose Tolerance, 2 Hours w/1 Hour - CBC - RPR - HIV antibody (with reflex)  2. History of cesarean delivery Undecided about mode of delivery  3. Maternal varicella, non-immune Needs immunization pp  4.  History of anemia Check B12 levels - Anemia Profile B  5. Vaginal irritation Selfswab - Cervicovaginal ancillary only( [place])  Preterm labor symptoms and general obstetric precautions including but not limited to vaginal bleeding, contractions, leaking of fluid and fetal movement were reviewed in detail with the patient. Please refer to After Visit Summary for other counseling recommendations.   Return in 2 weeks (on 04/01/2023).  Future Appointments  Date Time Provider Department Center  03/23/2023  1:30 PM Judi Saa, DO [facility] None    Reva Bores, MD

## 2023-03-19 ENCOUNTER — Encounter: Payer: Self-pay | Admitting: Family Medicine

## 2023-03-19 LAB — GLUCOSE TOLERANCE, 2 HOURS W/ 1HR
Glucose, 1 hour: 93 mg/dL (ref 70–179)
Glucose, 2 hour: 80 mg/dL (ref 70–152)
Glucose, Fasting: 78 mg/dL (ref 70–91)

## 2023-03-19 LAB — ANEMIA PROFILE B
Basophils Absolute: 0 10*3/uL (ref 0.0–0.2)
Basos: 0 %
EOS (ABSOLUTE): 0.1 10*3/uL (ref 0.0–0.4)
Eos: 1 %
Ferritin: 24 ng/mL (ref 15–150)
Folate: 17.5 ng/mL (ref >3.0)
Hematocrit: 35.8 % (ref 34.0–46.6)
Hemoglobin: 12.1 g/dL (ref 11.1–15.9)
Immature Grans (Abs): 0.1 10*3/uL (ref 0.0–0.1)
Immature Granulocytes: 1 %
Iron Saturation: 32 % (ref 15–55)
Iron: 118 ug/dL (ref 27–159)
Lymphocytes Absolute: 1.7 10*3/uL (ref 0.7–3.1)
Lymphs: 22 %
MCH: 33.5 pg — ABNORMAL HIGH (ref 26.6–33.0)
MCHC: 33.8 g/dL (ref 31.5–35.7)
MCV: 99 fL — ABNORMAL HIGH (ref 79–97)
Monocytes Absolute: 0.5 10*3/uL (ref 0.1–0.9)
Monocytes: 7 %
Neutrophils Absolute: 5.2 10*3/uL (ref 1.4–7.0)
Neutrophils: 69 %
Platelets: 146 10*3/uL — ABNORMAL LOW (ref 150–450)
RBC: 3.61 x10E6/uL — ABNORMAL LOW (ref 3.77–5.28)
RDW: 12.4 % (ref 11.7–15.4)
Retic Ct Pct: 2.3 % (ref 0.6–2.6)
Total Iron Binding Capacity: 366 ug/dL (ref 250–450)
UIBC: 248 ug/dL (ref 131–425)
Vitamin B-12: 607 pg/mL (ref 232–1245)
WBC: 7.5 10*3/uL (ref 3.4–10.8)

## 2023-03-19 LAB — CBC
Hematocrit: 33.8 % — ABNORMAL LOW (ref 34.0–46.6)
Hemoglobin: 11.7 g/dL (ref 11.1–15.9)
MCH: 33.6 pg — ABNORMAL HIGH (ref 26.6–33.0)
MCHC: 34.6 g/dL (ref 31.5–35.7)
MCV: 97 fL (ref 79–97)
Platelets: 148 10*3/uL — ABNORMAL LOW (ref 150–450)
RBC: 3.48 x10E6/uL — ABNORMAL LOW (ref 3.77–5.28)
RDW: 12.4 % (ref 11.7–15.4)
WBC: 7.2 10*3/uL (ref 3.4–10.8)

## 2023-03-19 LAB — HIV ANTIBODY (ROUTINE TESTING W REFLEX): HIV Screen 4th Generation wRfx: NONREACTIVE

## 2023-03-19 LAB — RPR: RPR Ser Ql: NONREACTIVE

## 2023-03-21 LAB — CERVICOVAGINAL ANCILLARY ONLY
Bacterial Vaginitis (gardnerella): NEGATIVE
Candida Glabrata: NEGATIVE
Candida Vaginitis: NEGATIVE
Comment: NEGATIVE
Comment: NEGATIVE
Comment: NEGATIVE

## 2023-03-22 NOTE — Progress Notes (Unsigned)
[name] [address] [place] [address] Phone: [phone] Subjective:   [name], am serving as a scribe for Dr. Antoine Primas.  I'm seeing this patient by the request  of:  Ardith Dark, MD  CC: Back and neck pain  [id number]  Maureen [name] is a 32 y.o. female coming in with complaint of back and neck pain. OMT on 02/16/2023. Patient states same per usual. No new concerns.  Medications patient has been prescribed:   Taking:         Reviewed prior external information including notes and imaging from previsou exam, outside providers and external EMR if available.   As well as notes that were available from care everywhere and other healthcare systems.  Past medical history, social, surgical and family history all reviewed in electronic medical record.  No pertanent information unless stated regarding to the chief complaint.   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), with abnormal movement    Heavy metal poisoning    s/p chelation   Hypermobile joints    Hypoglycemia    Left hip pain 01/03/2019   Mild intermittent asthma without complication 01/03/2019   Nonallopathic lesion of lumbosacral region 03/05/2019   Nonallopathic lesion of sacral region 03/05/2019   Nonallopathic lesion of thoracic region 03/05/2019   PTSD (post-traumatic stress disorder)    Right shoulder pain 05/28/2021   Skin-picking disorder    Vitamin D deficiency 02/21/2019    Allergies  Allergen Reactions   Cyclobenzaprine Other (See Comments)    Psychosis   Penicillins Hives     Review of Systems:  No headache, visual changes, nausea, vomiting, diarrhea, constipation, dizziness, abdominal pain, skin  rash, fevers, chills, night sweats, weight loss, swollen lymph nodes, body aches, joint swelling, chest pain, shortness of breath, mood changes. POSITIVE muscle aches  Objective  Blood pressure 110/76, pulse 79, height 5\' 8"  (1.727 m), weight 172 lb (78 kg), last menstrual period 09/22/2022, SpO2 96 %.   General: No apparent distress alert and oriented x3 mood and affect normal, dressed appropriately.  HEENT: Pupils equal, extraocular movements intact  Respiratory: Patient's speak in full sentences and does not appear short of breath  Cardiovascular: No lower extremity edema, non tender, no erythema  Patient is gravid at this moment.  Patient's back does have some increasing in lordosis noted.  Osteopathic findings  C3 flexed rotated and side bent right C7 flexed rotated and side bent left T3 extended rotated and side bent right inhaled rib T8 extended rotated and side bent left L3 flexed rotated and side bent right Sacrum right on right    Assessment and Plan:  Neck pain Patient does have some icing regimen at this time.  Discussed icing regimen and home exercises.  Patient is hypermobile and likely contributing as well.  Will continue to increase activity slowly.  Follow-up again in 6 to 8 weeks  Hip flexor tightness, left Tightness noted as well.  Discussed icing regimen and home exercises.  Discussed which activities to do and which ones to avoid.  Follow-up again in 6 to 8 weeks    Nonallopathic problems  Decision today to treat with OMT was based on Physical Exam  After verbal  consent patient was treated with HVLA, ME, FPR techniques in cervical, rib, thoracic, lumbar, and sacral  areas  Patient tolerated the procedure well with improvement in symptoms  Patient given exercises, stretches and lifestyle modifications  See medications in patient instructions if given  Patient will follow up in 4-8 weeks     The above documentation has been reviewed and is accurate and  complete Judi Saa, DO         Note: This dictation was prepared with Dragon dictation along with smaller phrase technology. Any transcriptional errors that result from this process are unintentional.

## 2023-03-23 ENCOUNTER — Ambulatory Visit (INDEPENDENT_AMBULATORY_CARE_PROVIDER_SITE_OTHER): Payer: Medicaid Other | Admitting: Family Medicine

## 2023-03-23 ENCOUNTER — Encounter: Payer: Self-pay | Admitting: Family Medicine

## 2023-03-23 VITALS — BP 110/76 | HR 79 | Ht 68.0 in | Wt 172.0 lb

## 2023-03-23 DIAGNOSIS — M9901 Segmental and somatic dysfunction of cervical region: Secondary | ICD-10-CM

## 2023-03-23 DIAGNOSIS — M9908 Segmental and somatic dysfunction of rib cage: Secondary | ICD-10-CM

## 2023-03-23 DIAGNOSIS — M24552 Contracture, left hip: Secondary | ICD-10-CM | POA: Diagnosis not present

## 2023-03-23 DIAGNOSIS — M9904 Segmental and somatic dysfunction of sacral region: Secondary | ICD-10-CM | POA: Diagnosis not present

## 2023-03-23 DIAGNOSIS — M9903 Segmental and somatic dysfunction of lumbar region: Secondary | ICD-10-CM | POA: Diagnosis not present

## 2023-03-23 DIAGNOSIS — M542 Cervicalgia: Secondary | ICD-10-CM

## 2023-03-23 DIAGNOSIS — M9902 Segmental and somatic dysfunction of thoracic region: Secondary | ICD-10-CM

## 2023-03-23 NOTE — Patient Instructions (Signed)
See you again in 3-4 weeks Okay to double

## 2023-03-23 NOTE — Assessment & Plan Note (Signed)
Tightness noted as well.  Discussed icing regimen and home exercises.  Discussed which activities to do and which ones to avoid.  Follow-up again in 6 to 8 weeks

## 2023-03-23 NOTE — Assessment & Plan Note (Signed)
Patient does have some icing regimen at this time.  Discussed icing regimen and home exercises.  Patient is hypermobile and likely contributing as well.  Will continue to increase activity slowly.  Follow-up again in 6 to 8 weeks

## 2023-03-25 LAB — TSH: TSH: 3.8 u[IU]/mL (ref 0.450–4.500)

## 2023-03-25 LAB — SPECIMEN STATUS REPORT

## 2023-04-06 NOTE — Progress Notes (Signed)
[name] [address] [place] [address] Phone: [phone] Subjective:   Bruce Donath, am serving as a scribe for Dr. Antoine Primas.  I'm seeing this patient by the request  of:  Ardith Dark, MD  CC: Back pain follow-up  [id number]  Maureen [name] is a 32 y.o. female coming in with complaint of back and neck pain. OMT on 03/23/2023. Patient states that she is hurting all over.  Patient is having significant amount of discomfort.  Has been packing her place open is going to be moving to [place] in the near future.       Reviewed prior external information including notes and imaging from previsou exam, outside providers and external EMR if available.   As well as notes that were available from care everywhere and other healthcare systems.  Past medical history, social, surgical and family history all reviewed in electronic medical record.  No pertanent information unless stated regarding to the chief complaint.   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), with abnormal movement    Heavy metal poisoning    s/p chelation   Hypermobile joints    Hypoglycemia    Left hip pain 01/03/2019   Mild intermittent asthma without complication 01/03/2019   Nonallopathic lesion of lumbosacral region 03/05/2019   Nonallopathic lesion of sacral region 03/05/2019   Nonallopathic lesion of thoracic region 03/05/2019   PTSD (post-traumatic stress disorder)    Right shoulder pain 05/28/2021   Skin-picking disorder    Vitamin D deficiency 02/21/2019    Allergies  Allergen Reactions   Cyclobenzaprine Other (See Comments)    Psychosis   Penicillins Hives     Review of Systems:  No headache, visual  changes, nausea, vomiting, diarrhea, constipation, dizziness, abdominal pain, skin rash, fevers, chills, night sweats, weight loss, swollen lymph nodes, body aches, joint swelling, chest pain, shortness of breath, mood changes. POSITIVE muscle aches  Objective  Blood pressure 102/78, pulse 90, height 5\' 8"  (1.727 m), weight 174 lb (78.9 kg), last menstrual period 09/22/2022, SpO2 98 %.   General: No apparent distress alert and oriented x3 mood and affect normal, dressed appropriately.  HEENT: Pupils equal, extraocular movements intact  Respiratory: Patient's speak in full sentences and does not appear short of breath  Cardiovascular: No lower extremity edema, non tender, no erythema  Patient is gravid, increase in lordosis of the lumbar spine.  Significant tightness of the left hip flexor noted.  Patient does have tightness noted with FABER test left greater than right.  Osteopathic findings  C6 flexed rotated and side bent left T3 extended rotated and side bent right inhaled rib T9 extended rotated and side bent left L2 flexed rotated and side bent right Sacrum left on left       Assessment and Plan:  Hip flexor tightness, left Patient did respond extremely well to osteopathic manipulation.  Patient is in the third trimester of her pregnancy.  Is moving and does have a significant amount of stress noted.  Discussed with patient about icing regimen and home exercises, which activities to do and which ones to avoid.  Moving to [place] and is working on getting her healthcare providers there at this time.  Nonallopathic problems  Decision today to treat with OMT was based on Physical Exam  After verbal consent patient was treated with HVLA, ME, FPR techniques in cervical, rib, thoracic, lumbar, and sacral  areas  Patient tolerated the procedure well with improvement in symptoms  Patient given exercises, stretches and lifestyle modifications       The above documentation  has been reviewed and is accurate and complete Judi Saa, DO         Note: This dictation was prepared with Dragon dictation along with smaller phrase technology. Any transcriptional errors that result from this process are unintentional.

## 2023-04-07 ENCOUNTER — Telehealth: Payer: Self-pay | Admitting: Family Medicine

## 2023-04-07 ENCOUNTER — Ambulatory Visit (INDEPENDENT_AMBULATORY_CARE_PROVIDER_SITE_OTHER): Payer: Medicaid Other | Admitting: Obstetrics and Gynecology

## 2023-04-07 ENCOUNTER — Other Ambulatory Visit: Payer: Self-pay

## 2023-04-07 VITALS — BP 105/66 | HR 86 | Wt 173.0 lb

## 2023-04-07 DIAGNOSIS — O0990 Supervision of high risk pregnancy, unspecified, unspecified trimester: Secondary | ICD-10-CM

## 2023-04-07 DIAGNOSIS — O0993 Supervision of high risk pregnancy, unspecified, third trimester: Secondary | ICD-10-CM

## 2023-04-07 DIAGNOSIS — O09893 Supervision of other high risk pregnancies, third trimester: Secondary | ICD-10-CM

## 2023-04-07 DIAGNOSIS — Z3A29 29 weeks gestation of pregnancy: Secondary | ICD-10-CM

## 2023-04-07 DIAGNOSIS — Z862 Personal history of diseases of the blood and blood-forming organs and certain disorders involving the immune mechanism: Secondary | ICD-10-CM

## 2023-04-07 DIAGNOSIS — Z98891 History of uterine scar from previous surgery: Secondary | ICD-10-CM

## 2023-04-07 DIAGNOSIS — Z2839 Other underimmunization status: Secondary | ICD-10-CM

## 2023-04-07 NOTE — Progress Notes (Signed)
   PRENATAL VISIT NOTE  Subjective:  Maureen [name] is a 32 y.o. G3P1011 at [redacted]w[redacted]d being seen today for ongoing prenatal care.  She is currently monitored for the following issues for this high-risk pregnancy and has Bipolar disorder (HCC); Major depression in remission (HCC); Hip flexor tightness, left; Hypermobility syndrome; Neck pain; History of fetal anomaly in prior pregnancy, currently pregnant; Supervision of high risk pregnancy, antepartum; History of cesarean delivery; and Maternal varicella, non-immune on their problem list.  Patient reports no complaints.  Contractions: Not present. Vag. Bleeding: None.  Movement: Present. Denies leaking of fluid.   The following portions of the patient's history were reviewed and updated as appropriate: allergies, current medications, past family history, past medical history, past social history, past surgical history and problem list.   Objective:   Vitals:   04/07/23 1514  BP: 105/66  Pulse: 86  Weight: 173 lb (78.5 kg)    Fetal Status: Fetal Heart Rate (bpm): 154 Fundal Height: 28 cm Movement: Present     General:  Alert, oriented and cooperative. Patient is in no acute distress.  Skin: Skin is warm and dry. No rash noted.   Cardiovascular: Normal heart rate noted  Respiratory: Normal respiratory effort, no problems with respiration noted  Abdomen: Soft, gravid, appropriate for gestational age.  Pain/Pressure: Absent     Pelvic: Cervical exam deferred        Extremities: Normal range of motion.     Mental Status: Normal mood and affect. Normal behavior. Normal judgment and thought content.   Assessment and Plan:  Pregnancy: G3P1011 at [redacted]w[redacted]d 1. Supervision of high risk pregnancy, antepartum BP and FHR normal FH appropriate Feeling regular fetal movement Plans on moving to [facility] July 11th, researching OB practices, discussed ROI   2. History of cesarean delivery Undecided on RCS vs TOLAC, is still researching OB  practices in [place] and looking into what they will offer  3. History of anemia Anemia profile normal, per notes with Dr Shawnie Pons, obtain tsh and cmp as another possible cause for labwork. TSH normal, checking cmp today  - Comp Met (CMET)  4. Maternal varicella, non-immune PP immunization  Preterm labor symptoms and general obstetric precautions including but not limited to vaginal bleeding, contractions, leaking of fluid and fetal movement were reviewed in detail with the patient. Please refer to After Visit Summary for other counseling recommendations.   Return in two weeks for routine OB visit   Future Appointments  Date Time Provider Department Center  04/11/2023 12:45 PM Judi Saa, DO [facility] None    Albertine Grates, FNP

## 2023-04-07 NOTE — Telephone Encounter (Signed)
Patient was seen today, she need a follow up visit in 2 weeks, I offered her an appointment and she said she will not be here due to she will be moving.  The appt that I scheduled was canceled per patient

## 2023-04-08 LAB — COMPREHENSIVE METABOLIC PANEL
ALT: 13 IU/L (ref 0–32)
AST: 22 IU/L (ref 0–40)
Albumin: 3.6 g/dL — ABNORMAL LOW (ref 4.0–5.0)
Alkaline Phosphatase: 75 IU/L (ref 44–121)
BUN/Creatinine Ratio: 16 (ref 9–23)
BUN: 9 mg/dL (ref 6–20)
Bilirubin Total: <0.2 mg/dL (ref 0.0–1.2)
CO2: 20 mmol/L (ref 20–29)
Calcium: 8.5 mg/dL — ABNORMAL LOW (ref 8.7–10.2)
Chloride: 105 mmol/L (ref 96–106)
Creatinine, Ser: 0.55 mg/dL — ABNORMAL LOW (ref 0.57–1.00)
Globulin, Total: 2.4 g/dL (ref 1.5–4.5)
Glucose: 86 mg/dL (ref 70–99)
Potassium: 4.2 mmol/L (ref 3.5–5.2)
Sodium: 137 mmol/L (ref 134–144)
Total Protein: 6 g/dL (ref 6.0–8.5)
eGFR: 126 mL/min/{1.73_m2} (ref >59)

## 2023-04-11 ENCOUNTER — Ambulatory Visit (INDEPENDENT_AMBULATORY_CARE_PROVIDER_SITE_OTHER): Payer: Medicaid Other | Admitting: Family Medicine

## 2023-04-11 ENCOUNTER — Encounter: Payer: Self-pay | Admitting: Family Medicine

## 2023-04-11 VITALS — BP 102/78 | HR 90 | Ht 68.0 in | Wt 174.0 lb

## 2023-04-11 DIAGNOSIS — M9903 Segmental and somatic dysfunction of lumbar region: Secondary | ICD-10-CM | POA: Diagnosis not present

## 2023-04-11 DIAGNOSIS — M9908 Segmental and somatic dysfunction of rib cage: Secondary | ICD-10-CM | POA: Diagnosis not present

## 2023-04-11 DIAGNOSIS — M9901 Segmental and somatic dysfunction of cervical region: Secondary | ICD-10-CM | POA: Diagnosis not present

## 2023-04-11 DIAGNOSIS — M9904 Segmental and somatic dysfunction of sacral region: Secondary | ICD-10-CM | POA: Diagnosis not present

## 2023-04-11 DIAGNOSIS — M24552 Contracture, left hip: Secondary | ICD-10-CM

## 2023-04-11 DIAGNOSIS — M9902 Segmental and somatic dysfunction of thoracic region: Secondary | ICD-10-CM

## 2023-04-11 NOTE — Assessment & Plan Note (Signed)
Patient did respond extremely well to osteopathic manipulation.  Patient is in the third trimester of her pregnancy.  Is moving and does have a significant amount of stress noted.  Discussed with patient about icing regimen and home exercises, which activities to do and which ones to avoid.  Moving to [place] and is working on getting her healthcare providers there at this time.

## 2023-04-13 ENCOUNTER — Encounter: Payer: Self-pay | Admitting: Family Medicine

## 2023-04-15 ENCOUNTER — Inpatient Hospital Stay (HOSPITAL_BASED_OUTPATIENT_CLINIC_OR_DEPARTMENT_OTHER): Payer: Medicaid Other

## 2023-04-15 ENCOUNTER — Encounter (HOSPITAL_COMMUNITY): Payer: Self-pay | Admitting: Family Medicine

## 2023-04-15 ENCOUNTER — Other Ambulatory Visit: Payer: Self-pay

## 2023-04-15 ENCOUNTER — Inpatient Hospital Stay (HOSPITAL_COMMUNITY)
Admission: AD | Admit: 2023-04-15 | Discharge: 2023-04-15 | Disposition: A | Discharge status: Home / Self Care | Payer: Medicaid Other | Attending: Family Medicine | Admitting: Family Medicine

## 2023-04-15 DIAGNOSIS — O26893 Other specified pregnancy related conditions, third trimester: Secondary | ICD-10-CM | POA: Diagnosis not present

## 2023-04-15 DIAGNOSIS — O352XX0 Maternal care for (suspected) hereditary disease in fetus, not applicable or unspecified: Secondary | ICD-10-CM

## 2023-04-15 DIAGNOSIS — N898 Other specified noninflammatory disorders of vagina: Secondary | ICD-10-CM

## 2023-04-15 DIAGNOSIS — O09299 Supervision of pregnancy with other poor reproductive or obstetric history, unspecified trimester: Secondary | ICD-10-CM

## 2023-04-15 DIAGNOSIS — Z3A30 30 weeks gestation of pregnancy: Secondary | ICD-10-CM | POA: Insufficient documentation

## 2023-04-15 DIAGNOSIS — O09293 Supervision of pregnancy with other poor reproductive or obstetric history, third trimester: Secondary | ICD-10-CM | POA: Insufficient documentation

## 2023-04-15 DIAGNOSIS — Z0371 Encounter for suspected problem with amniotic cavity and membrane ruled out: Secondary | ICD-10-CM | POA: Insufficient documentation

## 2023-04-15 DIAGNOSIS — O0990 Supervision of high risk pregnancy, unspecified, unspecified trimester: Secondary | ICD-10-CM

## 2023-04-15 DIAGNOSIS — O0993 Supervision of high risk pregnancy, unspecified, third trimester: Secondary | ICD-10-CM | POA: Insufficient documentation

## 2023-04-15 DIAGNOSIS — Z3493 Encounter for supervision of normal pregnancy, unspecified, third trimester: Secondary | ICD-10-CM

## 2023-04-15 LAB — WET PREP, GENITAL
Clue Cells Wet Prep HPF POC: NONE SEEN
Sperm: NONE SEEN
Trich, Wet Prep: NONE SEEN
WBC, Wet Prep HPF POC: >=10 — AB (ref <10)
Yeast Wet Prep HPF POC: NONE SEEN

## 2023-04-15 LAB — RUPTURE OF MEMBRANE (ROM)PLUS: Rom Plus: NEGATIVE

## 2023-04-15 NOTE — MAU Note (Signed)
Maureen [name] is a 32 y.o. at [redacted]w[redacted]d here in MAU reporting: "suspected PROM", states she's been leaking fluid for 10 weeks.  Reports fluid is clear.  Denies VB.  Endorses +FM. LMP: NA Onset of complaint: 10 weeks Pain score: 0 Vitals:   04/15/23 1557  BP: 118/76  Pulse: 85  Resp: 19  Temp: 98.1 F (36.7 C)  SpO2: 97%     FHT:135 bpm Lab orders placed from triage:   NA

## 2023-04-15 NOTE — MAU Provider Note (Signed)
History     CSN: [id number]  Arrival date and time: 04/15/23 1545   Event Date/Time   First Provider Initiated Contact with Patient 04/15/23 1656      Chief Complaint  Patient presents with   Rupture of Membranes   Maureen [name] , a  31 y.o. G3P1011 at [redacted]w[redacted]d presents to MAU with complaints of ongoing leaking of fluid. She states that over the last 10 weeks she notes some intermittent "gushes" of fluid indiscernible from water. She denies it having a color or odor. She states that it was not happening often and didn't think much about it. She endorses that for the last 2-3 days she has been having more frequent "teaspoon" amount of vaginal discharge. Reports that is has been soaking all the way through her underwear. She states that she has not been wearing a pad. She denies tickling down her legs and noted gushes more associated with exertion, like going from sitting to standing or walking. She noted that she has a history of "uterine prolapse" and states that she does not urinate as much as she used to but has not decreased her water intake. She denies vaginal bleeding or contractions.          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), with abnormal movement    Heavy metal poisoning    s/p chelation   Hypermobile joints    Hypoglycemia    Left hip pain 01/03/2019   Mild intermittent asthma without complication 01/03/2019   Nonallopathic lesion of lumbosacral region 03/05/2019   Nonallopathic lesion of sacral region 03/05/2019   Nonallopathic lesion of thoracic  region 03/05/2019   PTSD (post-traumatic stress disorder)    Right shoulder pain 05/28/2021   Skin-picking disorder    Vitamin D deficiency 02/21/2019    Past Surgical History:  Procedure Laterality Date   CESAREAN SECTION  10/24/2020   MULTIPLE TOOTH EXTRACTIONS     WISDOM TOOTH EXTRACTION      Family History  Problem Relation Age of Onset   Depression Mother    Blindness Father    Hypertension Father    Cancer Maternal Grandfather    Miscarriages / Stillbirths Paternal Grandmother     Social History   Tobacco Use   Smoking status: Never   Smokeless tobacco: Never  Vaping Use   Vaping Use: Never used  Substance Use Topics   Alcohol use: Not Currently    Comment: Rare   Drug use: Never    Allergies:  Allergies  Allergen Reactions   Cyclobenzaprine Other (See Comments)    Psychosis   Penicillins Hives    Medications Prior to Admission  Medication Sig Dispense Refill Last Dose   Cholecalciferol (VITAMIN D3) 50 MCG (2000 UT) capsule Take 2,000 Units by mouth daily.   04/15/2023   Docosahexaenoic  Acid (DHA) 200 MG CAPS Take 200 mg by mouth daily.   04/15/2023   Magnesium 400 MG CAPS    04/15/2023   Prenatal Vit-Fe Fumarate-FA (PRENATAL VITAMIN PO) Take 1 tablet by mouth daily. Rainbow lite   04/15/2023    Review of Systems  Constitutional:  Negative for chills, fatigue and fever.  Eyes:  Negative for pain and visual disturbance.  Respiratory:  Negative for apnea, shortness of breath and wheezing.   Cardiovascular:  Negative for chest pain and palpitations.  Gastrointestinal:  Negative for abdominal pain, constipation, diarrhea, nausea and vomiting.  Genitourinary:  Positive for vaginal discharge. Negative for difficulty urinating, dysuria, pelvic pain, vaginal bleeding and vaginal pain.  Musculoskeletal:  Negative for back pain.  Neurological:  Negative for seizures, weakness and headaches.  Psychiatric/Behavioral:  Negative for suicidal ideas.    Physical Exam    Blood pressure 113/75, pulse (!) 102, temperature 97.9 F (36.6 C), temperature source Oral, resp. rate 18, height 5\' 8"  (1.727 m), weight 79.7 kg, last menstrual period 09/22/2022, SpO2 97 %.  Physical Exam Vitals and nursing note reviewed. Exam conducted with a chaperone present.  Constitutional:      General: She is not in acute distress.    Appearance: Normal appearance.  HENT:     Head: Normocephalic.  Pulmonary:     Effort: Pulmonary effort is normal.  Genitourinary:    Comments: Copious amounts of milky white vaginal discharge noted on exam. No pooling noted on exam.  Musculoskeletal:     Cervical back: Normal range of motion.  Skin:    General: Skin is warm and dry.  Neurological:     Mental Status: She is alert and oriented to person, place, and time.  Psychiatric:        Mood and Affect: Mood normal.    FHT: 135 bpm with moderate variability. Accels present no decles noted.  Toco: quiet   MAU Course  Procedures Orders Placed This Encounter  Procedures   Wet prep, genital   [place] MFM OB Limited   Rupture of Membrane (ROM) Plus   Results for orders placed or performed during the hospital encounter of 04/15/23 (from the past 24 hour(s))  Wet prep, genital     Status: Abnormal   Collection Time: 04/15/23  6:30 PM   Specimen: Cervix  Result Value Ref Range   Yeast Wet Prep HPF POC NONE SEEN NONE SEEN   Trich, Wet Prep NONE SEEN NONE SEEN   Clue Cells Wet Prep HPF POC NONE SEEN NONE SEEN   WBC, Wet Prep HPF POC >=10 (A) <10   Sperm NONE SEEN   Rupture of Membrane (ROM) Plus     Status: None   Collection Time: 04/15/23  6:30 PM  Result Value Ref Range   Rom Plus NEGATIVE     MDM - Wet prep normal  - Negative ROM test  - Negative for pooling on exam  - [place] results noted normal AFI. 8.5cm pocket. Reassuring for not ruptured.  - plan for discharge.   Assessment and Plan   1. Supervision of high risk pregnancy, antepartum   2. History of fetal anomaly in  prior pregnancy, currently pregnant   3. Intact amniotic membranes during pregnancy in third trimester   4. [redacted] weeks gestation of pregnancy   5. Vaginal discharge    - Reviewed signs and symptoms of labor.  - FHT appropriate for gestational age at time of discharge.  - Recommended pelvic therapy in PP.  Suspicion for urinary incontinence  - Worsening signs and return precautions reviewed.  - Patient discharged home in stable condition and may return to MAU as needed.   Claudette Head, MSN CNM  04/15/2023, 4:56 PM

## 2023-04-21 ENCOUNTER — Telehealth: Payer: Medicaid Other | Admitting: Family Medicine

## 2023-04-21 ENCOUNTER — Encounter: Payer: Medicaid Other | Admitting: Family Medicine

## 2023-04-25 ENCOUNTER — Telehealth (INDEPENDENT_AMBULATORY_CARE_PROVIDER_SITE_OTHER): Payer: Medicaid Other | Admitting: Obstetrics and Gynecology

## 2023-04-25 DIAGNOSIS — O09299 Supervision of pregnancy with other poor reproductive or obstetric history, unspecified trimester: Secondary | ICD-10-CM

## 2023-04-25 DIAGNOSIS — O09899 Supervision of other high risk pregnancies, unspecified trimester: Secondary | ICD-10-CM

## 2023-04-25 DIAGNOSIS — O34219 Maternal care for unspecified type scar from previous cesarean delivery: Secondary | ICD-10-CM

## 2023-04-25 DIAGNOSIS — O09293 Supervision of pregnancy with other poor reproductive or obstetric history, third trimester: Secondary | ICD-10-CM

## 2023-04-25 DIAGNOSIS — Z98891 History of uterine scar from previous surgery: Secondary | ICD-10-CM

## 2023-04-25 DIAGNOSIS — O0990 Supervision of high risk pregnancy, unspecified, unspecified trimester: Secondary | ICD-10-CM

## 2023-04-25 DIAGNOSIS — Z3A31 31 weeks gestation of pregnancy: Secondary | ICD-10-CM

## 2023-04-25 NOTE — Progress Notes (Signed)
OBSTETRICS PRENATAL VIRTUAL VISIT ENCOUNTER NOTE  Provider location: [facility] for [facility]   Patient location: Home  I connected with Maureen [name] on 04/25/23 at 10:55 AM EDT by MyChart Video Encounter and verified that I am speaking with the correct person using two identifiers. I discussed the limitations, risks, security and privacy concerns of performing an evaluation and management service virtually and the availability of in person appointments. I also discussed with the patient that there may be a patient responsible charge related to this service. The patient expressed understanding and agreed to proceed. Subjective:  Maureen [name] is a 32 y.o. G3P1011 at [redacted]w[redacted]d being seen today for ongoing prenatal care.  She is currently monitored for the following issues for this low-risk pregnancy and has Bipolar disorder (HCC); Major depression in remission (HCC); Hip flexor tightness, left; Hypermobility syndrome; Neck pain; History of fetal anomaly in prior pregnancy, currently pregnant; Supervision of high risk pregnancy, antepartum; History of cesarean delivery; and Maternal varicella, non-immune on their problem list.  Patient reports no complaints.  Contractions: Not present. Vag. Bleeding: None.  Movement: Present. Denies any leaking of fluid.   The following portions of the patient's history were reviewed and updated as appropriate: allergies, current medications, past family history, past medical history, past social history, past surgical history and problem list.   Objective:  There were no vitals filed for this visit.  Fetal Status:     Movement: Present     General:  Alert, oriented and cooperative. Patient is in no acute distress.  Respiratory: Normal respiratory effort, no problems with respiration noted  Mental Status: Normal mood and affect. Normal behavior. Normal judgment and thought content.  Rest of physical exam deferred due  to type of encounter  Imaging: [place] MFM OB Limited  Result Date: 04/16/2023 ----------------------------------------------------------------------  OBSTETRICS REPORT                        (Signed Final 04/16/2023 11:11 am) ---------------------------------------------------------------------- Patient Info  ID #:       [id number]                          D.O.B.:  Sep 25, 1991 (31 yrs)  Name:       Maureen [name]           Visit Date: 04/15/2023 07:41 pm ---------------------------------------------------------------------- Performed By  Attending:        Ma Rings MD         Ref. Address:      [address]                                                              [place], [place]                                                              [id number]  Performed By:     Hurman Horn          [facility]  Referred By:      [place] Bing MD ---------------------------------------------------------------------- Orders  #  Description                           Code        Ordered By  1  [place] MFM OB LIMITED                     16109.60    Sandra Cockayne ----------------------------------------------------------------------  #  Order #                     Accession #                Episode #  1  [id number]                   [id number]                 [id number] ---------------------------------------------------------------------- Indications  Vaginal discharge during pregnancy in third     O26.893  trimester  [redacted] weeks gestation of pregnancy                 Z3A.30  Previous pregnancy with congenital anomaly      O35.2XX0  [facility]), antepartum ---------------------------------------------------------------------- Fetal Evaluation  Num Of Fetuses:          1  Fetal Heart Rate(bpm):   132  Cardiac Activity:        Observed  Presentation:            Breech   Placenta:                Anterior  P. Cord Insertion:       Visualized, central  Amniotic Fluid  AFI FV:      Within normal limits  AFI Sum(cm)     %Tile       Largest Pocket(cm)  22.2            88          8.3  RUQ(cm)       RLQ(cm)       LUQ(cm)        LLQ(cm)  3.5           4.1           8.3            6.3  Comment:    No placental abruption or previa identified. Stomach, bladder,              and diaphragm seen. ---------------------------------------------------------------------- OB History  Gravidity:    3         Term:  1        Prem:   0        SAB:   1  TOP:          0       Ectopic:  0        Living: 1 ---------------------------------------------------------------------- Gestational Age  LMP:           29w 2d        Date:  09/22/22                 EDD:   06/29/23  Best:          Georgiann Hahn 2d     Det. ByMarcella Dubs         EDD:   06/22/23                                      (12/07/22) ---------------------------------------------------------------------- Cervix Uterus Adnexa  Cervix  Length:            5.7  cm.  Closed  Uterus  No abnormality visualized.  Right Ovary  Within normal limits.  Left Ovary  Not visualized.  Adnexa  No abnormality visualized ---------------------------------------------------------------------- Comments  This patient presented to the MAU due to leakage of  fluid/vaginal discharge.  A limited ultrasound performed today shows that the fetus is  in the breech presentation.  There was normal amniotic fluid noted. ----------------------------------------------------------------------                  Ma Rings, MD Electronically Signed Final Report   04/16/2023 11:11 am ----------------------------------------------------------------------   Assessment and Plan:  Pregnancy: G3P1011 at [redacted]w[redacted]d 1. History of fetal anomaly in prior pregnancy, currently pregnant No anomalies seen this pregnancy  2. Supervision of high risk pregnancy, antepartum Continue routine prenatal care.   Pt advised to initiate prenatal care in her new area ASAP as she desires repeat cesarean section  3. [redacted] weeks gestation of pregnancy   4. Maternal varicella, non-immune Consider vaccination post delivery  5. History of cesarean delivery Pt desires repeat  c section  Preterm labor symptoms and general obstetric precautions including but not limited to vaginal bleeding, contractions, leaking of fluid and fetal movement were reviewed in detail with the patient. I discussed the assessment and treatment plan with the patient. The patient was provided an opportunity to ask questions and all were answered. The patient agreed with the plan and demonstrated an understanding of the instructions. The patient was advised to call back or seek an in-person office evaluation/go to MAU at [facility] for any urgent or concerning symptoms. Please refer to After Visit Summary for other counseling recommendations.   I provided 15 minutes of face-to-face time during this encounter.  Return in about 2 weeks (around 05/09/2023) for ROB, in person.  No future appointments.  Warden Fillers, MD Center for [employer], [facility]

## 2023-05-31 LAB — HORIZON CUSTOM
